# Patient Record
Sex: Female | Born: 1935 | Race: Black or African American | Hispanic: No | Marital: Married | Smoking: Never smoker
Health system: Southern US, Community
[De-identification: ages and names within clinical notes are randomized; demographics above are authoritative.]

## PROBLEM LIST (undated history)

## (undated) DIAGNOSIS — C50919 Malignant neoplasm of unspecified site of unspecified female breast: Secondary | ICD-10-CM

## (undated) DIAGNOSIS — I872 Venous insufficiency (chronic) (peripheral): Secondary | ICD-10-CM

## (undated) DIAGNOSIS — K219 Gastro-esophageal reflux disease without esophagitis: Secondary | ICD-10-CM

## (undated) DIAGNOSIS — Z923 Personal history of irradiation: Secondary | ICD-10-CM

## (undated) DIAGNOSIS — I10 Essential (primary) hypertension: Secondary | ICD-10-CM

## (undated) DIAGNOSIS — M1712 Unilateral primary osteoarthritis, left knee: Secondary | ICD-10-CM

## (undated) DIAGNOSIS — M48061 Spinal stenosis, lumbar region without neurogenic claudication: Secondary | ICD-10-CM

## (undated) DIAGNOSIS — M542 Cervicalgia: Secondary | ICD-10-CM

## (undated) DIAGNOSIS — I83893 Varicose veins of bilateral lower extremities with other complications: Secondary | ICD-10-CM

## (undated) DIAGNOSIS — H409 Unspecified glaucoma: Secondary | ICD-10-CM

## (undated) DIAGNOSIS — M7542 Impingement syndrome of left shoulder: Secondary | ICD-10-CM

## (undated) DIAGNOSIS — E1140 Type 2 diabetes mellitus with diabetic neuropathy, unspecified: Secondary | ICD-10-CM

## (undated) DIAGNOSIS — D696 Thrombocytopenia, unspecified: Secondary | ICD-10-CM

## (undated) DIAGNOSIS — R252 Cramp and spasm: Secondary | ICD-10-CM

## (undated) DIAGNOSIS — D369 Benign neoplasm, unspecified site: Secondary | ICD-10-CM

## (undated) DIAGNOSIS — Z96641 Presence of right artificial hip joint: Secondary | ICD-10-CM

## (undated) DIAGNOSIS — R809 Proteinuria, unspecified: Secondary | ICD-10-CM

## (undated) DIAGNOSIS — D649 Anemia, unspecified: Secondary | ICD-10-CM

## (undated) HISTORY — DX: Cramp and spasm: R25.2

## (undated) HISTORY — DX: Venous insufficiency (chronic) (peripheral): I87.2

## (undated) HISTORY — DX: Varicose veins of bilateral lower extremities with other complications: I83.893

## (undated) HISTORY — DX: Type 2 diabetes mellitus with diabetic neuropathy, unspecified: E11.40

## (undated) HISTORY — DX: Presence of right artificial hip joint: Z96.641

## (undated) HISTORY — PX: JOINT REPLACEMENT: SHX530

## (undated) HISTORY — DX: Anemia, unspecified: D64.9

## (undated) HISTORY — DX: Benign neoplasm, unspecified site: D36.9

## (undated) HISTORY — DX: Cervicalgia: M54.2

## (undated) HISTORY — DX: Proteinuria, unspecified: R80.9

## (undated) HISTORY — PX: REPLACEMENT TOTAL KNEE: SUR1224

## (undated) HISTORY — PX: CHOLECYSTECTOMY: SHX55

## (undated) HISTORY — DX: Essential (primary) hypertension: I10

## (undated) HISTORY — DX: Impingement syndrome of left shoulder: M75.42

## (undated) HISTORY — DX: Unilateral primary osteoarthritis, left knee: M17.12

## (undated) HISTORY — DX: Thrombocytopenia, unspecified: D69.6

## (undated) HISTORY — DX: Spinal stenosis, lumbar region without neurogenic claudication: M48.061

---

## 2004-05-07 ENCOUNTER — Ambulatory Visit: Payer: Self-pay | Admitting: General Surgery

## 2004-07-16 ENCOUNTER — Ambulatory Visit: Payer: Self-pay | Admitting: Oncology

## 2004-09-25 ENCOUNTER — Ambulatory Visit: Payer: Self-pay | Admitting: Gastroenterology

## 2005-01-01 ENCOUNTER — Ambulatory Visit: Payer: Self-pay | Admitting: Oncology

## 2005-01-14 ENCOUNTER — Ambulatory Visit: Payer: Self-pay | Admitting: Oncology

## 2005-05-20 ENCOUNTER — Ambulatory Visit: Payer: Self-pay | Admitting: General Surgery

## 2005-06-24 ENCOUNTER — Emergency Department: Payer: Self-pay | Admitting: Unknown Physician Specialty

## 2005-07-15 ENCOUNTER — Ambulatory Visit: Payer: Self-pay | Admitting: Oncology

## 2005-11-11 ENCOUNTER — Emergency Department: Payer: Self-pay | Admitting: General Practice

## 2005-12-27 ENCOUNTER — Ambulatory Visit: Payer: Self-pay | Admitting: Ophthalmology

## 2006-01-01 ENCOUNTER — Ambulatory Visit: Payer: Self-pay | Admitting: Oncology

## 2006-01-27 ENCOUNTER — Ambulatory Visit: Payer: Self-pay | Admitting: Oncology

## 2006-05-23 ENCOUNTER — Ambulatory Visit: Payer: Self-pay | Admitting: General Surgery

## 2006-07-22 ENCOUNTER — Ambulatory Visit: Payer: Self-pay | Admitting: Oncology

## 2006-08-07 ENCOUNTER — Ambulatory Visit: Payer: Self-pay | Admitting: Oncology

## 2006-10-12 ENCOUNTER — Ambulatory Visit: Payer: Self-pay | Admitting: Emergency Medicine

## 2007-02-07 ENCOUNTER — Ambulatory Visit: Payer: Self-pay | Admitting: Oncology

## 2007-02-10 ENCOUNTER — Ambulatory Visit: Payer: Self-pay | Admitting: Oncology

## 2007-03-09 ENCOUNTER — Ambulatory Visit: Payer: Self-pay | Admitting: Oncology

## 2007-05-05 ENCOUNTER — Ambulatory Visit: Payer: Self-pay | Admitting: Internal Medicine

## 2007-05-25 ENCOUNTER — Ambulatory Visit: Payer: Self-pay | Admitting: Oncology

## 2007-07-08 ENCOUNTER — Ambulatory Visit: Payer: Self-pay | Admitting: Oncology

## 2007-07-18 ENCOUNTER — Other Ambulatory Visit: Payer: Self-pay

## 2007-07-18 ENCOUNTER — Emergency Department: Payer: Self-pay | Admitting: Internal Medicine

## 2007-07-28 ENCOUNTER — Ambulatory Visit: Payer: Self-pay | Admitting: Oncology

## 2007-08-07 ENCOUNTER — Ambulatory Visit: Payer: Self-pay | Admitting: Oncology

## 2007-10-07 ENCOUNTER — Ambulatory Visit: Payer: Self-pay | Admitting: Gastroenterology

## 2008-06-03 ENCOUNTER — Ambulatory Visit: Payer: Self-pay | Admitting: Unknown Physician Specialty

## 2008-06-06 ENCOUNTER — Ambulatory Visit: Payer: Self-pay | Admitting: Internal Medicine

## 2009-01-25 ENCOUNTER — Ambulatory Visit: Payer: Self-pay | Admitting: Anesthesiology

## 2009-02-02 ENCOUNTER — Ambulatory Visit: Payer: Self-pay | Admitting: Anesthesiology

## 2009-02-17 ENCOUNTER — Ambulatory Visit: Payer: Self-pay | Admitting: Anesthesiology

## 2009-03-14 ENCOUNTER — Ambulatory Visit: Payer: Self-pay | Admitting: Anesthesiology

## 2009-04-19 ENCOUNTER — Ambulatory Visit: Payer: Self-pay | Admitting: Anesthesiology

## 2009-05-17 ENCOUNTER — Ambulatory Visit: Payer: Self-pay | Admitting: Anesthesiology

## 2009-06-08 ENCOUNTER — Ambulatory Visit: Payer: Self-pay | Admitting: Internal Medicine

## 2009-07-19 ENCOUNTER — Ambulatory Visit: Payer: Self-pay | Admitting: Anesthesiology

## 2009-09-27 ENCOUNTER — Ambulatory Visit: Payer: Self-pay | Admitting: Anesthesiology

## 2009-10-30 ENCOUNTER — Ambulatory Visit: Payer: Self-pay | Admitting: Unknown Physician Specialty

## 2009-12-17 ENCOUNTER — Emergency Department: Payer: Self-pay | Admitting: Unknown Physician Specialty

## 2009-12-29 ENCOUNTER — Ambulatory Visit: Payer: Self-pay | Admitting: Anesthesiology

## 2010-01-26 ENCOUNTER — Ambulatory Visit: Payer: Self-pay | Admitting: Anesthesiology

## 2010-02-10 ENCOUNTER — Emergency Department: Payer: Self-pay | Admitting: Emergency Medicine

## 2010-02-27 ENCOUNTER — Ambulatory Visit: Payer: Self-pay | Admitting: Anesthesiology

## 2010-05-18 ENCOUNTER — Ambulatory Visit: Payer: Self-pay | Admitting: Anesthesiology

## 2010-06-12 ENCOUNTER — Ambulatory Visit: Payer: Self-pay | Admitting: Internal Medicine

## 2010-06-12 ENCOUNTER — Ambulatory Visit: Payer: Self-pay | Admitting: Anesthesiology

## 2010-07-11 ENCOUNTER — Ambulatory Visit: Payer: Self-pay | Admitting: Anesthesiology

## 2010-07-30 ENCOUNTER — Ambulatory Visit: Payer: Self-pay | Admitting: Anesthesiology

## 2010-08-23 ENCOUNTER — Ambulatory Visit: Payer: Self-pay | Admitting: Unknown Physician Specialty

## 2010-11-30 ENCOUNTER — Ambulatory Visit: Payer: Self-pay | Admitting: Anesthesiology

## 2011-01-13 ENCOUNTER — Emergency Department: Payer: Self-pay | Admitting: *Deleted

## 2011-01-30 ENCOUNTER — Ambulatory Visit: Payer: Self-pay | Admitting: Ophthalmology

## 2011-02-24 ENCOUNTER — Ambulatory Visit: Payer: Self-pay

## 2011-03-27 ENCOUNTER — Ambulatory Visit: Payer: Self-pay | Admitting: Anesthesiology

## 2011-05-08 ENCOUNTER — Emergency Department: Payer: Self-pay | Admitting: Emergency Medicine

## 2011-05-08 LAB — COMPREHENSIVE METABOLIC PANEL
BUN: 16 mg/dL (ref 7–18)
Calcium, Total: 9.6 mg/dL (ref 8.5–10.1)
Chloride: 106 mmol/L (ref 98–107)
Co2: 27 mmol/L (ref 21–32)
EGFR (African American): >60
EGFR (Non-African Amer.): >60
Glucose: 106 mg/dL — ABNORMAL HIGH (ref 65–99)
Osmolality: 290 (ref 275–301)
SGOT(AST): 24 U/L (ref 15–37)
SGPT (ALT): 20 U/L
Sodium: 145 mmol/L (ref 136–145)
Total Protein: 7.2 g/dL (ref 6.4–8.2)

## 2011-05-17 ENCOUNTER — Ambulatory Visit: Payer: Self-pay | Admitting: Anesthesiology

## 2011-06-03 ENCOUNTER — Ambulatory Visit: Payer: Self-pay

## 2011-06-03 ENCOUNTER — Emergency Department: Payer: Self-pay | Admitting: Emergency Medicine

## 2011-06-03 LAB — CBC
HCT: 35.5 % (ref 35.0–47.0)
MCH: 28 pg (ref 26.0–34.0)
MCHC: 33.1 g/dL (ref 32.0–36.0)
MCV: 85 fL (ref 80–100)
Platelet: 136 10*3/uL — ABNORMAL LOW (ref 150–440)
RBC: 4.2 10*6/uL (ref 3.80–5.20)
RDW: 13.7 % (ref 11.5–14.5)

## 2011-06-03 LAB — COMPREHENSIVE METABOLIC PANEL
Alkaline Phosphatase: 70 U/L (ref 50–136)
Anion Gap: 7 (ref 7–16)
BUN: 12 mg/dL (ref 7–18)
Chloride: 106 mmol/L (ref 98–107)
Co2: 30 mmol/L (ref 21–32)
Creatinine: 0.89 mg/dL (ref 0.60–1.30)
EGFR (African American): >60
EGFR (Non-African Amer.): >60
Glucose: 101 mg/dL — ABNORMAL HIGH (ref 65–99)
Osmolality: 285 (ref 275–301)
SGPT (ALT): 19 U/L
Total Protein: 6.8 g/dL (ref 6.4–8.2)

## 2011-06-03 LAB — TROPONIN I: Troponin-I: <0.02 ng/mL

## 2011-06-13 ENCOUNTER — Ambulatory Visit: Payer: Self-pay | Admitting: Internal Medicine

## 2011-08-01 ENCOUNTER — Ambulatory Visit: Payer: Self-pay | Admitting: Anesthesiology

## 2011-09-12 ENCOUNTER — Ambulatory Visit: Payer: Self-pay | Admitting: Unknown Physician Specialty

## 2011-10-31 ENCOUNTER — Ambulatory Visit: Payer: Self-pay | Admitting: Unknown Physician Specialty

## 2011-12-03 ENCOUNTER — Ambulatory Visit: Admit: 2011-12-03 | Disposition: A | Discharge status: Home / Self Care | Payer: Self-pay | Admitting: Anesthesiology

## 2012-02-13 ENCOUNTER — Ambulatory Visit: Payer: Self-pay | Admitting: Anesthesiology

## 2012-03-17 ENCOUNTER — Ambulatory Visit: Payer: Self-pay | Admitting: Anesthesiology

## 2012-05-19 ENCOUNTER — Ambulatory Visit: Payer: Self-pay | Admitting: Anesthesiology

## 2012-06-24 ENCOUNTER — Ambulatory Visit: Payer: Self-pay | Admitting: Internal Medicine

## 2012-07-02 ENCOUNTER — Ambulatory Visit: Payer: Self-pay | Admitting: Internal Medicine

## 2012-07-08 ENCOUNTER — Ambulatory Visit: Payer: Self-pay | Admitting: Internal Medicine

## 2012-07-08 LAB — CREATININE, SERUM
EGFR (African American): >60
EGFR (Non-African Amer.): 54 — ABNORMAL LOW

## 2012-10-28 ENCOUNTER — Ambulatory Visit: Payer: Self-pay | Admitting: Anesthesiology

## 2013-01-11 ENCOUNTER — Ambulatory Visit: Payer: Self-pay | Admitting: Internal Medicine

## 2013-03-08 ENCOUNTER — Ambulatory Visit: Payer: Self-pay | Admitting: Anesthesiology

## 2013-06-25 ENCOUNTER — Ambulatory Visit: Payer: Self-pay | Admitting: Internal Medicine

## 2013-06-30 ENCOUNTER — Ambulatory Visit: Payer: Self-pay | Admitting: Unknown Physician Specialty

## 2013-07-22 ENCOUNTER — Encounter: Payer: Self-pay | Admitting: Internal Medicine

## 2013-07-23 LAB — URINALYSIS, COMPLETE
BILIRUBIN, UR: NEGATIVE
Bacteria: NONE SEEN
Blood: NEGATIVE
GLUCOSE, UR: NEGATIVE mg/dL (ref 0–75)
KETONE: NEGATIVE
Leukocyte Esterase: NEGATIVE
Nitrite: NEGATIVE
PH: 8 (ref 4.5–8.0)
Protein: NEGATIVE
RBC,UR: NONE SEEN /HPF (ref 0–5)
SPECIFIC GRAVITY: 1.009 (ref 1.003–1.030)
Squamous Epithelial: 1

## 2013-07-24 LAB — URINE CULTURE

## 2013-08-06 ENCOUNTER — Encounter: Payer: Self-pay | Admitting: Internal Medicine

## 2013-08-22 ENCOUNTER — Ambulatory Visit: Payer: Self-pay | Admitting: Physician Assistant

## 2013-09-09 DIAGNOSIS — Z96651 Presence of right artificial knee joint: Secondary | ICD-10-CM | POA: Insufficient documentation

## 2013-09-23 DIAGNOSIS — E1140 Type 2 diabetes mellitus with diabetic neuropathy, unspecified: Secondary | ICD-10-CM | POA: Insufficient documentation

## 2013-09-23 DIAGNOSIS — I872 Venous insufficiency (chronic) (peripheral): Secondary | ICD-10-CM

## 2013-09-23 DIAGNOSIS — R809 Proteinuria, unspecified: Secondary | ICD-10-CM

## 2013-09-23 DIAGNOSIS — M48061 Spinal stenosis, lumbar region without neurogenic claudication: Secondary | ICD-10-CM

## 2013-09-23 HISTORY — DX: Venous insufficiency (chronic) (peripheral): I87.2

## 2013-09-23 HISTORY — DX: Proteinuria, unspecified: R80.9

## 2013-09-23 HISTORY — DX: Spinal stenosis, lumbar region without neurogenic claudication: M48.061

## 2013-11-28 DIAGNOSIS — E1129 Type 2 diabetes mellitus with other diabetic kidney complication: Secondary | ICD-10-CM | POA: Insufficient documentation

## 2013-11-28 DIAGNOSIS — K219 Gastro-esophageal reflux disease without esophagitis: Secondary | ICD-10-CM | POA: Insufficient documentation

## 2013-11-28 DIAGNOSIS — D649 Anemia, unspecified: Secondary | ICD-10-CM

## 2013-11-28 DIAGNOSIS — D369 Benign neoplasm, unspecified site: Secondary | ICD-10-CM

## 2013-11-28 DIAGNOSIS — E1140 Type 2 diabetes mellitus with diabetic neuropathy, unspecified: Secondary | ICD-10-CM

## 2013-11-28 DIAGNOSIS — I10 Essential (primary) hypertension: Secondary | ICD-10-CM

## 2013-11-28 HISTORY — DX: Essential (primary) hypertension: I10

## 2013-11-28 HISTORY — DX: Anemia, unspecified: D64.9

## 2013-11-28 HISTORY — DX: Type 2 diabetes mellitus with diabetic neuropathy, unspecified: E11.40

## 2013-11-28 HISTORY — DX: Benign neoplasm, unspecified site: D36.9

## 2013-11-29 DIAGNOSIS — R252 Cramp and spasm: Secondary | ICD-10-CM

## 2013-11-29 HISTORY — DX: Cramp and spasm: R25.2

## 2013-12-14 DIAGNOSIS — Z96641 Presence of right artificial hip joint: Secondary | ICD-10-CM

## 2013-12-14 HISTORY — DX: Presence of right artificial hip joint: Z96.641

## 2014-03-01 DIAGNOSIS — M542 Cervicalgia: Secondary | ICD-10-CM | POA: Insufficient documentation

## 2014-03-01 DIAGNOSIS — M7542 Impingement syndrome of left shoulder: Secondary | ICD-10-CM

## 2014-03-01 HISTORY — DX: Impingement syndrome of left shoulder: M75.42

## 2014-05-18 ENCOUNTER — Emergency Department: Payer: Self-pay | Admitting: Emergency Medicine

## 2014-06-15 DIAGNOSIS — Z96641 Presence of right artificial hip joint: Secondary | ICD-10-CM | POA: Insufficient documentation

## 2014-07-26 ENCOUNTER — Ambulatory Visit
Admit: 2014-07-26 | Disposition: A | Discharge status: Home / Self Care | Payer: Self-pay | Attending: Family Medicine | Admitting: Family Medicine

## 2014-07-27 ENCOUNTER — Ambulatory Visit
Admit: 2014-07-27 | Disposition: A | Discharge status: Home / Self Care | Payer: Self-pay | Attending: Internal Medicine | Admitting: Internal Medicine

## 2014-09-06 DIAGNOSIS — M25561 Pain in right knee: Secondary | ICD-10-CM

## 2014-09-06 DIAGNOSIS — G8929 Other chronic pain: Secondary | ICD-10-CM | POA: Insufficient documentation

## 2014-09-06 DIAGNOSIS — I83893 Varicose veins of bilateral lower extremities with other complications: Secondary | ICD-10-CM

## 2014-09-06 HISTORY — DX: Varicose veins of bilateral lower extremities with other complications: I83.893

## 2015-01-18 DIAGNOSIS — M79604 Pain in right leg: Secondary | ICD-10-CM | POA: Insufficient documentation

## 2015-01-18 DIAGNOSIS — R600 Localized edema: Secondary | ICD-10-CM | POA: Insufficient documentation

## 2015-01-18 DIAGNOSIS — M79605 Pain in left leg: Secondary | ICD-10-CM

## 2015-03-16 ENCOUNTER — Ambulatory Visit
Admission: EM | Admit: 2015-03-16 | Discharge: 2015-03-16 | Disposition: A | Discharge status: Home / Self Care | Payer: Medicare Other | Attending: Family Medicine | Admitting: Family Medicine

## 2015-03-16 DIAGNOSIS — J40 Bronchitis, not specified as acute or chronic: Secondary | ICD-10-CM | POA: Diagnosis not present

## 2015-03-16 DIAGNOSIS — R059 Cough, unspecified: Secondary | ICD-10-CM

## 2015-03-16 DIAGNOSIS — R05 Cough: Secondary | ICD-10-CM

## 2015-03-16 HISTORY — DX: Essential (primary) hypertension: I10

## 2015-03-16 HISTORY — DX: Gastro-esophageal reflux disease without esophagitis: K21.9

## 2015-03-16 MED ORDER — BENZONATATE 200 MG PO CAPS: 200.0000 mg | ORAL_CAPSULE | Freq: Three times a day (TID) | ORAL | Status: DC | PRN

## 2015-03-16 MED ORDER — AZITHROMYCIN 250 MG PO TABS: ORAL_TABLET | ORAL | Status: DC

## 2015-03-16 MED ORDER — ALBUTEROL SULFATE HFA 108 (90 BASE) MCG/ACT IN AERS: 2.0000 | INHALATION_SPRAY | RESPIRATORY_TRACT | Status: AC | PRN

## 2015-03-16 NOTE — ED Provider Notes (Signed)
CSN: [id number]     Arrival date & time 03/16/15  1200 History   First MD Initiated Contact with Patient 03/16/15 1323    Nurses notes were reviewed. Chief Complaint  Patient presents with  . Cough   Patient reports having a cough for 2 weeks. States doesn't wake her up but she does cough all during the day and night when she goes to bed. She does report having some wheezing but no shortness of breath. The cough is not productive when it is his usage is clear. She states doesn't usually get colds or chest congestion often.    (Consider location/radiation/quality/duration/timing/severity/associated sxs/prior Treatment) Patient is a 81 y.o. female presenting with cough. The history is provided by the patient. No language interpreter was used.  Cough Cough characteristics:  Non-productive and productive Sputum characteristics:  Clear Severity:  Moderate Duration:  2 weeks Timing:  Intermittent Progression:  Worsening Chronicity:  New Context: upper respiratory infection   Relieved by:  Nothing Ineffective treatments:  None tried Associated symptoms: wheezing   Associated symptoms: no chest pain, no fever, no shortness of breath and no sinus congestion   Risk factors: recent infection     Past Medical History  Diagnosis Date  . Hypertension   . GERD (gastroesophageal reflux disease)    Past Surgical History  Procedure Laterality Date  . Cholecystectomy    . Joint replacement     Family History  Problem Relation Age of Onset  . Diabetes Mother   . Heart failure Mother   . Heart failure Father    Social History  Substance Use Topics  . Smoking status: Never Smoker   . Smokeless tobacco: None  . Alcohol Use: No   OB History    No data available     Review of Systems  Constitutional: Negative for fever.  Respiratory: Positive for cough and wheezing. Negative for shortness of breath.   Cardiovascular: Negative for chest pain.  All other systems reviewed and are  negative.   Allergies  Trazodone and nefazodone  Home Medications   Prior to Admission medications   Medication Sig Start Date End Date Taking? Authorizing Provider  acetaminophen (TYLENOL) 500 MG tablet Take 500 mg by mouth every 6 (six) hours as needed.   Yes Historical Provider, MD  aspirin 325 MG EC tablet Take 325 mg by mouth daily.   Yes Historical Provider, MD  Fish Oil-Cholecalciferol (FISH OIL + D3 PO) Take by mouth.   Yes Historical Provider, MD  furosemide (LASIX) 20 MG tablet Take 20 mg by mouth.   Yes Historical Provider, MD  latanoprost (XALATAN) 0.005 % ophthalmic solution 1 drop at bedtime.   Yes Historical Provider, MD  losartan (COZAAR) 25 MG tablet Take 25 mg by mouth daily.   Yes Historical Provider, MD  magnesium oxide (MAG-OX) 400 MG tablet Take 400 mg by mouth daily.   Yes Historical Provider, MD  omeprazole (PRILOSEC) 10 MG capsule Take 10 mg by mouth daily.   Yes Historical Provider, MD  vitamin B-12 (CYANOCOBALAMIN) 50 MCG tablet Take 50 mcg by mouth daily.   Yes Historical Provider, MD  Vitamin D, Ergocalciferol, (DRISDOL) 50000 UNITS CAPS capsule Take 50,000 Units by mouth every 7 (seven) days.   Yes Historical Provider, MD  albuterol (PROVENTIL HFA;VENTOLIN HFA) 108 (90 BASE) MCG/ACT inhaler Inhale 2 puffs into the lungs every 4 (four) hours as needed for wheezing or shortness of breath. 03/16/15   Frederich Cha, MD  azithromycin (ZITHROMAX Z-PAK) 250 MG  tablet Take 2 tablets first day and then 1 po a day for 4 days 03/16/15   Frederich Cha, MD  benzonatate (TESSALON) 200 MG capsule Take 1 capsule (200 mg total) by mouth 3 (three) times daily as needed for cough. 03/16/15   Frederich Cha, MD   Meds Ordered and Administered this Visit  Medications - No data to display  BP 195/78 mmHg  Pulse 77  Temp(Src) 96.5 F (35.8 C) (Tympanic)  Resp 20  Ht 5\' 4"  (1.626 m)  Wt 206 lb (93.441 kg)  BMI 35.34 kg/m2  SpO2 100% No data found.   Physical Exam  Constitutional:  She is oriented to person, place, and time. She appears well-developed and well-nourished.  HENT:  Head: Normocephalic and atraumatic.  Eyes: Conjunctivae are normal. Pupils are equal, round, and reactive to light.  Neck: Normal range of motion. Neck supple. No tracheal deviation present.  Cardiovascular: Normal rate, regular rhythm and normal heart sounds.   Pulmonary/Chest: Effort normal and breath sounds normal.  Abdominal: Soft.  Musculoskeletal: Normal range of motion. She exhibits no edema.  Neurological: She is alert and oriented to person, place, and time.  Skin: Skin is warm and dry.  Psychiatric: She has a normal mood and affect. Her behavior is normal.  Vitals reviewed.   ED Course  Procedures (including critical care time)  Labs Review Labs Reviewed - No data to display  Imaging Review No results found.   Visual Acuity Review  Right Eye Distance:   Left Eye Distance:   Bilateral Distance:    Right Eye Near:   Left Eye Near:    Bilateral Near:         MDM   1. Bronchitis   2. Cough    We'll place on Tessalon Perles, Z-Pak and albuterol inhaler but ask her to see her PCP if not better in a week to 2 weeks.    Frederich Cha, MD 03/16/15 1351

## 2015-03-16 NOTE — ED Notes (Signed)
Sates has had a cough x 2 week. Denies SOB. No fever

## 2015-03-16 NOTE — Discharge Instructions (Signed)
How to Use an Inhaler Using your inhaler correctly is very important. Good technique will make sure that the medicine reaches your lungs.  HOW TO USE AN INHALER:  Take the cap off the inhaler.  If this is the first time using your inhaler, you need to prime it. Shake the inhaler for 5 seconds. Release four puffs into the air, away from your face. Ask your doctor for help if you have questions.  Shake the inhaler for 5 seconds.  Turn the inhaler so the bottle is above the mouthpiece.  Put your pointer finger on top of the bottle. Your thumb holds the bottom of the inhaler.  Open your mouth.  Either hold the inhaler away from your mouth (the width of 2 fingers) or place your lips tightly around the mouthpiece. Ask your doctor which way to use your inhaler.  Breathe out as much air as possible.  Breathe in and push down on the bottle 1 time to release the medicine. You will feel the medicine go in your mouth and throat.  Continue to take a deep breath in very slowly. Try to fill your lungs.  After you have breathed in completely, hold your breath for 10 seconds. This will help the medicine to settle in your lungs. If you cannot hold your breath for 10 seconds, hold it for as long as you can before you breathe out.  Breathe out slowly, through pursed lips. Whistling is an example of pursed lips.  If your doctor has told you to take more than 1 puff, wait at least 15-30 seconds between puffs. This will help you get the best results from your medicine. Do not use the inhaler more than your doctor tells you to.  Put the cap back on the inhaler.  Follow the directions from your doctor or from the inhaler package about cleaning the inhaler. If you use more than one inhaler, ask your doctor which inhalers to use and what order to use them in. Ask your doctor to help you figure out when you will need to refill your inhaler.  If you use a steroid inhaler, always rinse your mouth with water  after your last puff, gargle and spit out the water. Do not swallow the water. GET HELP IF:  The inhaler medicine only partially helps to stop wheezing or shortness of breath.  You are having trouble using your inhaler.  You have some increase in thick spit (phlegm). GET HELP RIGHT AWAY IF:  The inhaler medicine does not help your wheezing or shortness of breath or you have tightness in your chest.  You have dizziness, headaches, or fast heart rate.  You have chills, fever, or night sweats.  You have a large increase of thick spit, or your thick spit is bloody. MAKE SURE YOU:   Understand these instructions.  Will watch your condition.  Will get help right away if you are not doing well or get worse.   This information is not intended to replace advice given to you by your health care provider. Make sure you discuss any questions you have with your health care provider.   Document Released: 01/02/2008 Document Revised: 01/13/2013 Document Reviewed: 10/22/2012 Elsevier Interactive Patient Education 2016 [employer]  Upper Respiratory Infection, Adult Most upper respiratory infections (URIs) are caused by a virus. A URI affects the nose, throat, and upper air passages. The most common type of URI is often called "the common cold." HOME CARE   Take medicines only as  told by your doctor.  Gargle warm saltwater or take cough drops to comfort your throat as told by your doctor.  Use a warm mist humidifier or inhale steam from a shower to increase air moisture. This may make it easier to breathe.  Drink enough fluid to keep your pee (urine) clear or pale yellow.  Eat soups and other clear broths.  Have a healthy diet.  Rest as needed.  Go back to work when your fever is gone or your doctor says it is okay.  You may need to stay home longer to avoid giving your URI to others.  You can also wear a face mask and wash your hands often to prevent spread of the virus.  Use  your inhaler more if you have asthma.  Do not use any tobacco products, including cigarettes, chewing tobacco, or electronic cigarettes. If you need help quitting, ask your doctor. GET HELP IF:  You are getting worse, not better.  Your symptoms are not helped by medicine.  You have chills.  You are getting more short of breath.  You have brown or red mucus.  You have yellow or brown discharge from your nose.  You have pain in your face, especially when you bend forward.  You have a fever.  You have puffy (swollen) neck glands.  You have pain while swallowing.  You have white areas in the back of your throat. GET HELP RIGHT AWAY IF:   You have very bad or constant:  Headache.  Ear pain.  Pain in your forehead, behind your eyes, and over your cheekbones (sinus pain).  Chest pain.  You have long-lasting (chronic) lung disease and any of the following:  Wheezing.  Long-lasting cough.  Coughing up blood.  A change in your usual mucus.  You have a stiff neck.  You have changes in your:  Vision.  Hearing.  Thinking.  Mood. MAKE SURE YOU:   Understand these instructions.  Will watch your condition.  Will get help right away if you are not doing well or get worse.   This information is not intended to replace advice given to you by your health care provider. Make sure you discuss any questions you have with your health care provider.   Document Released: 09/11/2007 Document Revised: 08/09/2014 Document Reviewed: 06/30/2013 Elsevier Interactive Patient Education 2016 Elsevier Inc. Acute Bronchitis Bronchitis is when the airways that extend from the windpipe into the lungs get red, puffy, and painful (inflamed). Bronchitis often causes thick spit (mucus) to develop. This leads to a cough. A cough is the most common symptom of bronchitis. In acute bronchitis, the condition usually begins suddenly and goes away over time (usually in 2 weeks). Smoking,  allergies, and asthma can make bronchitis worse. Repeated episodes of bronchitis may cause more lung problems. HOME CARE  Rest.  Drink enough fluids to keep your pee (urine) clear or pale yellow (unless you need to limit fluids as told by your doctor).  Only take over-the-counter or prescription medicines as told by your doctor.  Avoid smoking and secondhand smoke. These can make bronchitis worse. If you are a smoker, think about using nicotine gum or skin patches. Quitting smoking will help your lungs heal faster.  Reduce the chance of getting bronchitis again by:  Washing your hands often.  Avoiding people with cold symptoms.  Trying not to touch your hands to your mouth, nose, or eyes.  Follow up with your doctor as told. GET HELP IF: Your symptoms  do not improve after 1 week of treatment. Symptoms include:  Cough.  Fever.  Coughing up thick spit.  Body aches.  Chest congestion.  Chills.  Shortness of breath.  Sore throat. GET HELP RIGHT AWAY IF:   You have an increased fever.  You have chills.  You have severe shortness of breath.  You have bloody thick spit (sputum).  You throw up (vomit) often.  You lose too much body fluid (dehydration).  You have a severe headache.  You faint. MAKE SURE YOU:   Understand these instructions.  Will watch your condition.  Will get help right away if you are not doing well or get worse.   This information is not intended to replace advice given to you by your health care provider. Make sure you discuss any questions you have with your health care provider.   Document Released: 09/11/2007 Document Revised: 11/25/2012 Document Reviewed: 09/15/2012 Elsevier Interactive Patient Education [employer].

## 2015-04-06 DIAGNOSIS — M1712 Unilateral primary osteoarthritis, left knee: Secondary | ICD-10-CM

## 2015-04-06 HISTORY — DX: Unilateral primary osteoarthritis, left knee: M17.12

## 2015-05-16 DIAGNOSIS — M542 Cervicalgia: Secondary | ICD-10-CM | POA: Insufficient documentation

## 2015-05-16 HISTORY — DX: Cervicalgia: M54.2

## 2015-05-18 ENCOUNTER — Other Ambulatory Visit: Payer: Self-pay | Admitting: Unknown Physician Specialty

## 2015-05-18 DIAGNOSIS — M542 Cervicalgia: Secondary | ICD-10-CM

## 2015-06-05 ENCOUNTER — Other Ambulatory Visit: Payer: Self-pay | Admitting: Internal Medicine

## 2015-06-05 DIAGNOSIS — Z1231 Encounter for screening mammogram for malignant neoplasm of breast: Secondary | ICD-10-CM

## 2015-06-05 DIAGNOSIS — D696 Thrombocytopenia, unspecified: Secondary | ICD-10-CM | POA: Insufficient documentation

## 2015-06-05 HISTORY — DX: Thrombocytopenia, unspecified: D69.6

## 2015-06-13 ENCOUNTER — Ambulatory Visit
Admission: RE | Admit: 2015-06-13 | Discharge: 2015-06-13 | Disposition: A | Discharge status: Home / Self Care | Payer: Medicare HMO | Source: Ambulatory Visit | Attending: Unknown Physician Specialty | Admitting: Unknown Physician Specialty

## 2015-06-13 DIAGNOSIS — M542 Cervicalgia: Secondary | ICD-10-CM | POA: Insufficient documentation

## 2015-06-13 DIAGNOSIS — M47812 Spondylosis without myelopathy or radiculopathy, cervical region: Secondary | ICD-10-CM | POA: Diagnosis not present

## 2015-06-13 DIAGNOSIS — G959 Disease of spinal cord, unspecified: Secondary | ICD-10-CM | POA: Insufficient documentation

## 2015-07-31 ENCOUNTER — Ambulatory Visit: Payer: Medicare Other | Attending: Internal Medicine

## 2015-08-02 ENCOUNTER — Ambulatory Visit
Admission: EM | Admit: 2015-08-02 | Discharge: 2015-08-02 | Disposition: A | Discharge status: Home / Self Care | Payer: Medicare HMO | Attending: Family Medicine | Admitting: Family Medicine

## 2015-08-02 DIAGNOSIS — M545 Low back pain, unspecified: Secondary | ICD-10-CM

## 2015-08-02 DIAGNOSIS — N390 Urinary tract infection, site not specified: Secondary | ICD-10-CM

## 2015-08-02 HISTORY — DX: Unspecified glaucoma: H40.9

## 2015-08-02 LAB — URINALYSIS COMPLETE WITH MICROSCOPIC (ARMC ONLY)
BILIRUBIN URINE: NEGATIVE
Glucose, UA: NEGATIVE mg/dL
KETONES UR: NEGATIVE mg/dL
Nitrite: NEGATIVE
PH: 6 (ref 5.0–8.0)
PROTEIN: NEGATIVE mg/dL
SPECIFIC GRAVITY, URINE: 1.01 (ref 1.005–1.030)

## 2015-08-02 MED ORDER — NITROFURANTOIN MONOHYD MACRO 100 MG PO CAPS: 100.0000 mg | ORAL_CAPSULE | Freq: Two times a day (BID) | ORAL | Status: DC

## 2015-08-02 NOTE — ED Provider Notes (Signed)
CSN: [id number]     Arrival date & time 08/02/15  1303 History   First MD Initiated Contact with Patient 08/02/15 1407     Chief Complaint  Patient presents with  . Back Pain    2 weeks of right low back pain in absence of injury. "I'm peeing a lot" but denies pain with urination. Pain is worse with walking   (Consider location/radiation/quality/duration/timing/severity/associated sxs/prior Treatment) HPI: Patient presents today with lower back pain. Patient denies any trauma or fall. She has been using Tylenol for her generalized pain. She also does admit to some urinary frequency. She does admit to getting UTIs. She denies any fever, chills, confusion, headache, abdominal pain, nausea, vomiting. She denies any known history of any back problems. She denies incontinence or foot drop or weakness of lower extremities. She does use a cane to get around normally.  Past Medical History  Diagnosis Date  . Hypertension   . GERD (gastroesophageal reflux disease)   . Glaucoma    Past Surgical History  Procedure Laterality Date  . Cholecystectomy    . Joint replacement     Family History  Problem Relation Age of Onset  . Diabetes Mother   . Heart failure Mother   . Heart failure Father    Social History  Substance Use Topics  . Smoking status: Never Smoker   . Smokeless tobacco: None  . Alcohol Use: No   OB History    No data available     Review of Systems: Negative except mentioned above.   Allergies  Trazodone and nefazodone  Home Medications   Prior to Admission medications   Medication Sig Start Date End Date Taking? Authorizing Provider  acetaminophen (TYLENOL) 500 MG tablet Take 500 mg by mouth every 6 (six) hours as needed.   Yes Historical Provider, MD  aspirin 325 MG EC tablet Take 325 mg by mouth daily.   Yes Historical Provider, MD  Fish Oil-Cholecalciferol (FISH OIL + D3 PO) Take by mouth.   Yes Historical Provider, MD  furosemide (LASIX) 20 MG tablet Take 20 mg  by mouth.   Yes Historical Provider, MD  latanoprost (XALATAN) 0.005 % ophthalmic solution 1 drop at bedtime.   Yes Historical Provider, MD  losartan (COZAAR) 25 MG tablet Take 25 mg by mouth daily.   Yes Historical Provider, MD  magnesium oxide (MAG-OX) 400 MG tablet Take 400 mg by mouth daily.   Yes Historical Provider, MD  omeprazole (PRILOSEC) 10 MG capsule Take 10 mg by mouth daily.   Yes Historical Provider, MD  Vitamin D, Ergocalciferol, (DRISDOL) 50000 UNITS CAPS capsule Take 50,000 Units by mouth every 7 (seven) days.   Yes Historical Provider, MD  albuterol (PROVENTIL HFA;VENTOLIN HFA) 108 (90 BASE) MCG/ACT inhaler Inhale 2 puffs into the lungs every 4 (four) hours as needed for wheezing or shortness of breath. 03/16/15   Frederich Cha, MD  azithromycin (ZITHROMAX Z-PAK) 250 MG tablet Take 2 tablets first day and then 1 po a day for 4 days 03/16/15   Frederich Cha, MD  benzonatate (TESSALON) 200 MG capsule Take 1 capsule (200 mg total) by mouth 3 (three) times daily as needed for cough. 03/16/15   Frederich Cha, MD  nitrofurantoin, macrocrystal-monohydrate, (MACROBID) 100 MG capsule Take 1 capsule (100 mg total) by mouth 2 (two) times daily. 08/02/15   Paulina Fusi, MD  vitamin B-12 (CYANOCOBALAMIN) 50 MCG tablet Take 50 mcg by mouth daily.    Historical Provider, MD   Meds Ordered  and Administered this Visit  Medications - No data to display  BP 131/64 mmHg  Pulse 76  Temp(Src) 98 F (36.7 C) (Oral)  Resp 22  Ht 5\' 4"  (1.626 m)  Wt 208 lb (94.348 kg)  BMI 35.69 kg/m2  SpO2 100% No data found.   Physical Exam  GENERAL: NAD HEENT: no pharyngeal erythema, no exudate RESP: CTA B CARD: RRR ABD: +BS, NT/ND, no rebound or guarding, no flank tenderness MSK: generalized lower back tenderness, FROM, negative SLR, no foot drop appreciated  NEURO: AAO ED Course  Procedures (including critical care time)  Labs Review Labs Reviewed  URINALYSIS COMPLETEWITH MICROSCOPIC ([facility] ONLY) -  Abnormal; Notable for the following:    APPearance CLOUDY (*)    Hgb urine dipstick TRACE (*)    Leukocytes, UA 3+ (*)    Bacteria, UA MANY (*)    Squamous Epithelial / LPF 0-5 (*)    All other components within normal limits  URINE CULTURE    Imaging Review No results found.    MDM    1. Urinary tract infection without hematuria, site unspecified   2. Midline low back pain without sciatica    Will treat patient for UTI to see if lower back pain resolves. I did discuss with patient that she likely does have degenerative changes of her lower back given her age. I do recommend that she follow-up with her primary care physician if symptoms do persist or worsen as discussed. If she has any problems with incontinence or foot drop she should be seen immediately. Patient addresses understanding. She will continue taking her Tylenol and low dose Ibuprofen when necessary for pain.     Paulina Fusi, MD 08/02/15 [phone]

## 2015-08-04 LAB — URINE CULTURE

## 2015-08-08 ENCOUNTER — Ambulatory Visit
Admission: RE | Admit: 2015-08-08 | Discharge: 2015-08-08 | Disposition: A | Discharge status: Home / Self Care | Payer: Medicare HMO | Source: Ambulatory Visit | Attending: Internal Medicine | Admitting: Internal Medicine

## 2015-08-08 DIAGNOSIS — Z1231 Encounter for screening mammogram for malignant neoplasm of breast: Secondary | ICD-10-CM | POA: Insufficient documentation

## 2015-08-08 HISTORY — DX: Malignant neoplasm of unspecified site of unspecified female breast: C50.919

## 2015-08-17 DIAGNOSIS — G8929 Other chronic pain: Secondary | ICD-10-CM | POA: Insufficient documentation

## 2015-08-17 DIAGNOSIS — M545 Low back pain: Secondary | ICD-10-CM

## 2015-12-28 ENCOUNTER — Other Ambulatory Visit: Payer: Self-pay | Admitting: Unknown Physician Specialty

## 2015-12-28 DIAGNOSIS — M545 Low back pain: Principal | ICD-10-CM

## 2015-12-28 DIAGNOSIS — G8929 Other chronic pain: Secondary | ICD-10-CM

## 2016-01-11 ENCOUNTER — Ambulatory Visit
Admission: RE | Admit: 2016-01-11 | Discharge: 2016-01-11 | Disposition: A | Discharge status: Home / Self Care | Payer: Medicare HMO | Source: Ambulatory Visit | Attending: Unknown Physician Specialty | Admitting: Unknown Physician Specialty

## 2016-01-11 DIAGNOSIS — M48061 Spinal stenosis, lumbar region without neurogenic claudication: Secondary | ICD-10-CM | POA: Insufficient documentation

## 2016-01-11 DIAGNOSIS — M8938 Hypertrophy of bone, other site: Secondary | ICD-10-CM | POA: Insufficient documentation

## 2016-01-11 DIAGNOSIS — M545 Low back pain: Secondary | ICD-10-CM | POA: Diagnosis present

## 2016-01-11 DIAGNOSIS — M5126 Other intervertebral disc displacement, lumbar region: Secondary | ICD-10-CM | POA: Diagnosis not present

## 2016-01-11 DIAGNOSIS — G8929 Other chronic pain: Secondary | ICD-10-CM | POA: Diagnosis not present

## 2016-01-11 DIAGNOSIS — M4807 Spinal stenosis, lumbosacral region: Secondary | ICD-10-CM | POA: Insufficient documentation

## 2016-01-11 DIAGNOSIS — M4317 Spondylolisthesis, lumbosacral region: Secondary | ICD-10-CM | POA: Insufficient documentation

## 2016-01-11 DIAGNOSIS — M1288 Other specific arthropathies, not elsewhere classified, other specified site: Secondary | ICD-10-CM | POA: Diagnosis not present

## 2016-05-16 ENCOUNTER — Other Ambulatory Visit: Payer: Self-pay

## 2016-05-16 DIAGNOSIS — R350 Frequency of micturition: Secondary | ICD-10-CM

## 2016-05-17 ENCOUNTER — Other Ambulatory Visit
Admission: RE | Admit: 2016-05-17 | Discharge: 2016-05-17 | Disposition: A | Discharge status: Home / Self Care | Payer: Medicare HMO | Source: Ambulatory Visit | Attending: Urology | Admitting: Urology

## 2016-05-17 ENCOUNTER — Ambulatory Visit (INDEPENDENT_AMBULATORY_CARE_PROVIDER_SITE_OTHER): Payer: Medicare HMO | Admitting: Urology

## 2016-05-17 ENCOUNTER — Encounter: Payer: Self-pay | Admitting: Urology

## 2016-05-17 VITALS — BP 156/77 | HR 76 | Ht 64.0 in | Wt 208.0 lb

## 2016-05-17 DIAGNOSIS — R350 Frequency of micturition: Secondary | ICD-10-CM

## 2016-05-17 DIAGNOSIS — R32 Unspecified urinary incontinence: Secondary | ICD-10-CM | POA: Diagnosis not present

## 2016-05-17 DIAGNOSIS — N3941 Urge incontinence: Secondary | ICD-10-CM

## 2016-05-17 DIAGNOSIS — N3000 Acute cystitis without hematuria: Secondary | ICD-10-CM

## 2016-05-17 LAB — URINALYSIS, COMPLETE (UACMP) WITH MICROSCOPIC
BILIRUBIN URINE: NEGATIVE
Glucose, UA: NEGATIVE mg/dL
HGB URINE DIPSTICK: NEGATIVE
KETONES UR: NEGATIVE mg/dL
NITRITE: POSITIVE — AB
PH: 5.5 (ref 5.0–8.0)
Protein, ur: NEGATIVE mg/dL
RBC / HPF: NONE SEEN RBC/hpf (ref 0–5)
Specific Gravity, Urine: 1.01 (ref 1.005–1.030)

## 2016-05-17 LAB — BLADDER SCAN AMB NON-IMAGING

## 2016-05-17 NOTE — Progress Notes (Signed)
05/17/2016 3:26 PM   Shannon [name] [birth year] [id number]  Referring provider: Ezequiel Kayser, MD [place] [facility] [place], [place] [address]  Chief Complaint  Patient presents with  . Urinary Frequency    New patient    HPI: Pleasant 83 year old female who presents today accompanied by her daughter for further evaluation of urinary frequency, nocturia, and enuresis.  She reports that over the past year, she's had significantly worsening of her urinary symptoms including daytime urgency, frequency along with significant nocturia and episodes of enuresis. During the day, she occasionally will be able to make it to the bathroom in time and wet herself. At nighttime, she lays a towel on her bed is often she can't get up and make it to the restroom on time or will just wake up wet.  She denies any gross hematuria, dysuria, or history of recent or recurrent urinary tract infections.  It does appear that she was treated in 07/2015 with Macrobid for an Escherichia coli urinary tract infection.  This was pansensitive.  She does ambulate with a walker and is somewhat unsteady on her feet.  Although she denies a history of diabetes, it is mentioned in her chart. Her most recent hemoglobin A1c was 5.6 in 05/2015.  She does have a personal history of breast cancer.  She drinks mostly water throughout the day into the late evening.  She denies ever being previously evaluated by urologist. She currently takes no medications for OAB.   PMH: Past Medical History:  Diagnosis Date  . Adenomatous polyps 11/28/2013   Overview:  Colonoscopy 03/04/2000  . Anemia 11/28/2013  . Benign essential hypertension 11/28/2013  . Breast cancer ([place])   . Cervical pain 05/16/2015  . Diabetic neuropathy ([place]) 11/28/2013  . GERD (gastroesophageal reflux disease)   . Glaucoma   . Hypertension   . Impingement syndrome of left shoulder 03/01/2014  . Lumbar spinal stenosis 09/23/2013  . Muscle  cramps 11/29/2013  . Primary osteoarthritis of left knee 04/06/2015  . Proteinuria 09/23/2013  . Status post total hip replacement, right 12/14/2013  . Thrombocytopenia ([place]) 06/05/2015  . Varicose veins of leg with swelling, bilateral 09/06/2014  . Venous insufficiency of both lower extremities 09/23/2013   Overview:  H/O ulceration    Surgical History: Past Surgical History:  Procedure Laterality Date  . CHOLECYSTECTOMY    . JOINT REPLACEMENT      Home Medications:  Allergies as of 05/17/2016      Reactions   Trazodone And Nefazodone Other (See Comments)   Hallucinate      Medication List       Accurate as of 05/17/16  3:26 PM. Always use your most recent med list.          acetaminophen 500 MG tablet Commonly known as:  TYLENOL Take 500 mg by mouth every 6 (six) hours as needed.   albuterol 108 (90 Base) MCG/ACT inhaler Commonly known as:  PROVENTIL HFA;VENTOLIN HFA Inhale 2 puffs into the lungs every 4 (four) hours as needed for wheezing or shortness of breath.   aspirin EC 81 MG tablet Take 81 mg by mouth daily.   FISH OIL + D3 PO Take by mouth.   furosemide 20 MG tablet Commonly known as:  LASIX Take 20 mg by mouth.   latanoprost 0.005 % ophthalmic solution Commonly known as:  XALATAN 1 drop at bedtime.   losartan 25 MG tablet Commonly known as:  COZAAR Take 25 mg by mouth daily.  magnesium oxide 400 MG tablet Commonly known as:  MAG-OX Take 400 mg by mouth daily.   omeprazole 10 MG capsule Commonly known as:  PRILOSEC Take 10 mg by mouth daily.   vitamin B-12 50 MCG tablet Commonly known as:  CYANOCOBALAMIN Take 50 mcg by mouth daily.   Vitamin D (Ergocalciferol) 50000 units Caps capsule Commonly known as:  DRISDOL Take 50,000 Units by mouth every 7 (seven) days.       Allergies:  Allergies  Allergen Reactions  . Trazodone And Nefazodone Other (See Comments)    Hallucinate    Family History: Family History  Problem Relation Age of  Onset  . Diabetes Mother   . Heart failure Mother   . Heart failure Father   . Breast cancer Sister     Social History:  reports that she has never smoked. She has never used smokeless tobacco. She reports that she does not drink alcohol. Her drug history is not on file.  ROS: UROLOGY Frequent Urination?: Yes Hard to postpone urination?: No Burning/pain with urination?: No Get up at night to urinate?: Yes Leakage of urine?: No Urine stream starts and stops?: No Trouble starting stream?: No Do you have to strain to urinate?: No Blood in urine?: No Urinary tract infection?: No Sexually transmitted disease?: No Injury to kidneys or bladder?: No Painful intercourse?: No Weak stream?: No Currently pregnant?: No Vaginal bleeding?: No Last menstrual period?: n  Gastrointestinal Nausea?: No Vomiting?: No Indigestion/heartburn?: No Diarrhea?: No Constipation?: No  Constitutional Fever: No Night sweats?: No Weight loss?: No Fatigue?: No  Skin Skin rash/lesions?: No Itching?: No  Eyes Blurred vision?: Yes Double vision?: No  Ears/Nose/Throat Sore throat?: No Sinus problems?: No  Hematologic/Lymphatic Swollen glands?: No Easy bruising?: No  Cardiovascular Leg swelling?: No Chest pain?: No  Respiratory Cough?: No Shortness of breath?: No  Endocrine Excessive thirst?: No  Musculoskeletal Back pain?: Yes Joint pain?: Yes  Neurological Headaches?: No Dizziness?: No  Psychologic Depression?: No Anxiety?: No  Physical Exam: BP (!) 156/77   Pulse 76   Ht 5\' 4"  (1.626 m)   Wt 208 lb (94.3 kg)   BMI 35.70 kg/m   Constitutional:  Alert and oriented, No acute distress.  Extremely pleasant. Copy by daughter today. HEENT: [place] AT, moist mucus membranes.  Trachea midline, no masses. Cardiovascular: No clubbing, cyanosis, or edema. Respiratory: Normal respiratory effort, no increased work of breathing. GI: Abdomen is soft, nontender, nondistended, no  abdominal masses GU: No CVA tenderness. Skin: No rashes, bruises or suspicious lesions. Neurologic: Grossly intact, no focal deficits, moving all 4 extremities.  Ambulating deliberately, slow with cane. Psychiatric: Normal mood and affect.  Laboratory Data: Lab Results  Component Value Date   WBC 4.9 06/03/2011   HGB 11.8 (L) 06/03/2011   HCT 35.5 06/03/2011   MCV 85 06/03/2011   PLT 136 (L) 06/03/2011    Lab Results  Component Value Date   CREATININE 0.96 07/08/2012    Urinalysis Component     Latest Ref Rng & Units 05/17/2016  Color, Urine     YELLOW YELLOW  Appearance     CLEAR CLEAR  Glucose     NEGATIVE mg/dL NEGATIVE  Bilirubin Urine     NEGATIVE NEGATIVE  Ketones, ur     NEGATIVE mg/dL NEGATIVE  Specific Gravity, Urine     1.005 - 1.030 1.010  Hgb urine dipstick     NEGATIVE NEGATIVE  pH     5.0 - 8.0 5.5  Protein  NEGATIVE mg/dL NEGATIVE  Nitrite     NEGATIVE POSITIVE (A)  Leukocytes, UA     NEGATIVE SMALL (A)  RBC / HPF     0 - 5 RBC/hpf NONE SEEN  WBC, UA     0 - 5 WBC/hpf 6-30  Bacteria, UA     NONE SEEN MANY (A)  Squamous Epithelial / LPF     NONE SEEN 0-5 (A)    Pertinent Imaging: Results for orders placed or performed in visit on 05/17/16  BLADDER SCAN AMB NON-IMAGING  Result Value Ref Range   Scan Result 33ml     Assessment & Plan:   Pleasant 83 year old female with worsening urinary urgency, frequency, incontinence over the past year. UA today is frankly positive in highly suspicious for infection which was certainly could be the cause or at least exacerbating her OAB symptoms.  We will plan to treat this infection and then reassess her voiding symptoms once the inflammatationlrelated to cystitis has resolved.     1. Acute cystitis without hematuria Will treat based on culture and sensitivity data once resulted Advised to call for treatment sooner if any worsening of symptoms including fevers, chills, n/v, AMS  2. Urinary  frequency - BLADDER SCAN AMB NON-IMAGING - Urine culture; Future  3. Urge incontinence As above  4. Enuresis As above   Return in about 2 months (around 07/15/2016) for reassess voiding symptoms, UA.  Hollice Espy, MD  [facility] [address] [place], [place] [address] [phone]

## 2016-05-20 ENCOUNTER — Telehealth: Payer: Self-pay

## 2016-05-20 DIAGNOSIS — N390 Urinary tract infection, site not specified: Secondary | ICD-10-CM

## 2016-05-20 LAB — URINE CULTURE: Culture: >=100000 — AB

## 2016-05-20 MED ORDER — SULFAMETHOXAZOLE-TRIMETHOPRIM 800-160 MG PO TABS: 1.0000 | ORAL_TABLET | Freq: Two times a day (BID) | ORAL | 0 refills | Status: AC

## 2016-05-20 NOTE — Telephone Encounter (Signed)
Spoke with pt in reference +ucx. Made aware abx were sent to pharmacy. Pt voiced understanding.

## 2016-05-20 NOTE — Telephone Encounter (Signed)
-----   Message from Hollice Espy, MD sent at 05/20/2016 12:06 PM EST ----- Please treat with Bactrim DS bid x 7 days.   Hollice Espy, MD

## 2016-07-18 ENCOUNTER — Other Ambulatory Visit: Payer: Self-pay

## 2016-07-18 DIAGNOSIS — R350 Frequency of micturition: Secondary | ICD-10-CM

## 2016-07-19 ENCOUNTER — Other Ambulatory Visit
Admission: RE | Admit: 2016-07-19 | Discharge: 2016-07-19 | Disposition: A | Discharge status: Home / Self Care | Payer: Medicare HMO | Source: Ambulatory Visit | Attending: Urology | Admitting: Urology

## 2016-07-19 ENCOUNTER — Ambulatory Visit (INDEPENDENT_AMBULATORY_CARE_PROVIDER_SITE_OTHER): Payer: Medicare HMO | Admitting: Urology

## 2016-07-19 ENCOUNTER — Encounter: Payer: Self-pay | Admitting: Urology

## 2016-07-19 VITALS — BP 153/74 | HR 67 | Ht 64.0 in | Wt 207.0 lb

## 2016-07-19 DIAGNOSIS — N3941 Urge incontinence: Secondary | ICD-10-CM | POA: Diagnosis not present

## 2016-07-19 DIAGNOSIS — R32 Unspecified urinary incontinence: Secondary | ICD-10-CM

## 2016-07-19 DIAGNOSIS — R350 Frequency of micturition: Secondary | ICD-10-CM

## 2016-07-19 LAB — URINALYSIS, COMPLETE (UACMP) WITH MICROSCOPIC
BILIRUBIN URINE: NEGATIVE
GLUCOSE, UA: NEGATIVE mg/dL
Hgb urine dipstick: NEGATIVE
KETONES UR: NEGATIVE mg/dL
Nitrite: NEGATIVE
PH: 5.5 (ref 5.0–8.0)
PROTEIN: NEGATIVE mg/dL
RBC / HPF: NONE SEEN RBC/hpf (ref 0–5)
Specific Gravity, Urine: 1.015 (ref 1.005–1.030)

## 2016-08-06 NOTE — Progress Notes (Signed)
07/19/2016 5:59 PM   Shannon [name] 1924/07/10 [id number]  Referring provider: Ezequiel Kayser, MD [facility] [place], [place] [address]  Chief Complaint  Patient presents with  . Cystitis    2 month follow up  . Urinary Frequency    HPI: Pleasant 83 year old female who presents today who returns Today for follow-up.   At last visit, she was found to have occult UTI which was thought to perhaps be contributory to her urinary symptoms. She was treated with a week long course of Bactrim for this and her urine appears clear today. Unfortunately, her symptoms did not improve or change with treatment.  She reports that over the past year, she's had significantly worsening of her urinary symptoms including daytime urgency, frequency along with significant nocturia and episodes of enuresis. During the day, she occasionally will be able to make it to the bathroom in time and wet herself. At nighttime, she lays a towel on her bed is often she can't get up and make it to the restroom on time or will just wake up wet.  PVR at last visit minimal (34 cc)   PMH: Past Medical History:  Diagnosis Date  . Adenomatous polyps 11/28/2013   Overview:  Colonoscopy 03/04/2000  . Anemia 11/28/2013  . Benign essential hypertension 11/28/2013  . Breast cancer ([place])   . Cervical pain 05/16/2015  . Diabetic neuropathy ([place]) 11/28/2013  . GERD (gastroesophageal reflux disease)   . Glaucoma   . Hypertension   . Impingement syndrome of left shoulder 03/01/2014  . Lumbar spinal stenosis 09/23/2013  . Muscle cramps 11/29/2013  . Primary osteoarthritis of left knee 04/06/2015  . Proteinuria 09/23/2013  . Status post total hip replacement, right 12/14/2013  . Thrombocytopenia ([place]) 06/05/2015  . Varicose veins of leg with swelling, bilateral 09/06/2014  . Venous insufficiency of both lower extremities 09/23/2013   Overview:  H/O ulceration    Surgical History: Past Surgical History:    Procedure Laterality Date  . CHOLECYSTECTOMY    . JOINT REPLACEMENT    . REPLACEMENT TOTAL KNEE Right     Home Medications:  Allergies as of 07/19/2016      Reactions   Trazodone And Nefazodone Other (See Comments)   Hallucinate      Medication List       Accurate as of 07/19/16 11:59 PM. Always use your most recent med list.          acetaminophen 500 MG tablet Commonly known as:  TYLENOL Take 500 mg by mouth every 6 (six) hours as needed.   albuterol 108 (90 Base) MCG/ACT inhaler Commonly known as:  PROVENTIL HFA;VENTOLIN HFA Inhale 2 puffs into the lungs every 4 (four) hours as needed for wheezing or shortness of breath.   amLODipine 2.5 MG tablet Commonly known as:  NORVASC Take by mouth.   aspirin EC 81 MG tablet Take 81 mg by mouth daily.   CALCIUM ASCORBATE PO Take by mouth.   calcium carbonate 1250 (500 Ca) MG chewable tablet Commonly known as:  OS-CAL Chew by mouth.   FISH OIL + D3 PO Take by mouth.   FISH OIL PO Take by mouth.   furosemide 20 MG tablet Commonly known as:  LASIX Take 20 mg by mouth.   latanoprost 0.005 % ophthalmic solution Commonly known as:  XALATAN 1 drop at bedtime.   losartan 25 MG tablet Commonly known as:  COZAAR Take 25 mg by mouth daily.   magnesium  oxide 400 MG tablet Commonly known as:  MAG-OX Take 400 mg by mouth daily.   MULTI-VITAMINS Tabs Take by mouth.   omeprazole 10 MG capsule Commonly known as:  PRILOSEC Take 10 mg by mouth daily.   vitamin B-12 50 MCG tablet Commonly known as:  CYANOCOBALAMIN Take 50 mcg by mouth daily.   Vitamin D (Ergocalciferol) 50000 units Caps capsule Commonly known as:  DRISDOL Take 50,000 Units by mouth every 7 (seven) days.       Allergies:  Allergies  Allergen Reactions  . Trazodone And Nefazodone Other (See Comments)    Hallucinate    Family History: Family History  Problem Relation Age of Onset  . Diabetes Mother   . Heart failure Mother   . Heart  failure Father   . Breast cancer Sister   . Prostate cancer Brother   . Kidney cancer Neg Hx   . Bladder Cancer Neg Hx     Social History:  reports that she has never smoked. She has never used smokeless tobacco. She reports that she does not drink alcohol or use drugs.  ROS: UROLOGY Frequent Urination?: Yes Hard to postpone urination?: No Burning/pain with urination?: No Get up at night to urinate?: No Leakage of urine?: No Urine stream starts and stops?: No Trouble starting stream?: No Do you have to strain to urinate?: No Blood in urine?: No Urinary tract infection?: No Sexually transmitted disease?: No Injury to kidneys or bladder?: No Painful intercourse?: No Weak stream?: No Currently pregnant?: No Vaginal bleeding?: No Last menstrual period?: n  Gastrointestinal Nausea?: No Vomiting?: No Indigestion/heartburn?: No Diarrhea?: No Constipation?: No  Constitutional Fever: No Night sweats?: No Weight loss?: No Fatigue?: No  Skin Skin rash/lesions?: No Itching?: No  Eyes Blurred vision?: No Double vision?: No  Ears/Nose/Throat Sore throat?: No Sinus problems?: No  Hematologic/Lymphatic Swollen glands?: No Easy bruising?: No  Cardiovascular Leg swelling?: No Chest pain?: No  Respiratory Cough?: No Shortness of breath?: No  Endocrine Excessive thirst?: No  Musculoskeletal Back pain?: Yes Joint pain?: No  Neurological Headaches?: No Dizziness?: No  Psychologic Depression?: No Anxiety?: No  Physical Exam: BP (!) 153/74   Pulse 67   Ht 5\' 4"  (1.626 m)   Wt 207 lb (93.9 kg)   BMI 35.53 kg/m   Constitutional:  Alert and oriented, No acute distress.  Extremely pleasant. Accompained by daughter today. HEENT:  AT, moist mucus membranes.  Trachea midline, no masses. Cardiovascular: No clubbing, cyanosis, or edema. Respiratory: Normal respiratory effort, no increased work of breathing. GI: Abdomen is soft, nontender, nondistended, no  abdominal masses GU: No CVA tenderness. Skin: No rashes, bruises or suspicious lesions. Neurologic: Grossly intact, no focal deficits, moving all 4 extremities.  Ambulating deliberately, slow with cane. Psychiatric: Normal mood and affect.  Laboratory Data: Lab Results  Component Value Date   WBC 4.9 06/03/2011   HGB 11.8 (L) 06/03/2011   HCT 35.5 06/03/2011   MCV 85 06/03/2011   PLT 136 (L) 06/03/2011    Lab Results  Component Value Date   CREATININE 0.96 07/08/2012    Urinalysis Results for orders placed or performed during the hospital encounter of 07/19/16  Urinalysis, Complete w Microscopic  Result Value Ref Range   Color, Urine YELLOW YELLOW   APPearance CLEAR CLEAR   Specific Gravity, Urine 1.015 1.005 - 1.030   pH 5.5 5.0 - 8.0   Glucose, UA NEGATIVE NEGATIVE mg/dL   Hgb urine dipstick NEGATIVE NEGATIVE   Bilirubin Urine NEGATIVE  NEGATIVE   Ketones, ur NEGATIVE NEGATIVE mg/dL   Protein, ur NEGATIVE NEGATIVE mg/dL   Nitrite NEGATIVE NEGATIVE   Leukocytes, UA TRACE (A) NEGATIVE   Squamous Epithelial / LPF 0-5 (A) NONE SEEN   WBC, UA 0-5 0 - 5 WBC/hpf   RBC / HPF NONE SEEN 0 - 5 RBC/hpf   Bacteria, UA FEW (A) NONE SEEN   Hyaline Casts, UA PRESENT     Pertinent Imaging: n/a  Assessment & Plan:   Pleasant 83 year old female with worsening urinary urgency, frequency, incontinence over the past year.   We discussed possible various treatment options for her bladder overactivity including medication therapy. Given her age, I would like to avoid anticholinergics due to side effects and possible confusion. We discussed a trial of Mybetriq.  After lengthy discussion today, she ultimately decided for no intervention. She is not to take any more pills. She will live with her symptoms and is not particularly bothered by this. Both she and her daughter are in agreement. All questions were answered.  1. Urinary frequency - BLADDER SCAN AMB NON-IMAGING - Urine culture;  Future  2. Urge incontinence As above  3 Enuresis As above  Follow up as needed  Hollice Espy, MD  [facility] [address], [place], [place] [address] [phone]

## 2016-09-24 ENCOUNTER — Other Ambulatory Visit: Payer: Self-pay | Admitting: Internal Medicine

## 2016-09-24 DIAGNOSIS — Z1231 Encounter for screening mammogram for malignant neoplasm of breast: Secondary | ICD-10-CM

## 2016-10-02 ENCOUNTER — Ambulatory Visit
Admission: RE | Admit: 2016-10-02 | Discharge: 2016-10-02 | Disposition: A | Discharge status: Home / Self Care | Payer: Medicare HMO | Source: Ambulatory Visit | Attending: Internal Medicine | Admitting: Internal Medicine

## 2016-10-02 DIAGNOSIS — Z1231 Encounter for screening mammogram for malignant neoplasm of breast: Secondary | ICD-10-CM | POA: Insufficient documentation

## 2016-10-02 HISTORY — DX: Personal history of irradiation: Z92.3

## 2016-11-27 ENCOUNTER — Ambulatory Visit
Admission: EM | Admit: 2016-11-27 | Discharge: 2016-11-27 | Disposition: A | Discharge status: Home / Self Care | Payer: Medicare HMO | Attending: Family Medicine | Admitting: Family Medicine

## 2016-11-27 DIAGNOSIS — J069 Acute upper respiratory infection, unspecified: Secondary | ICD-10-CM

## 2016-11-27 DIAGNOSIS — B9789 Other viral agents as the cause of diseases classified elsewhere: Secondary | ICD-10-CM

## 2016-11-27 NOTE — ED Triage Notes (Signed)
Patient complains of cough and congestion, wheezing and shortness of breath that started yesterday.

## 2016-11-27 NOTE — ED Provider Notes (Signed)
[facility]    CSN: [id number] Arrival date & time: 11/27/16  1444     History   Chief Complaint Chief Complaint  Patient presents with  . Cough    HPI Shannon [name] is a 83 y.o. female.   The history is provided by the patient.  URI  Presenting symptoms: congestion, cough and rhinorrhea   Severity:  Moderate Onset quality:  Sudden Duration:  1 day Timing:  Constant Chronicity:  New Relieved by:  None tried Ineffective treatments:  None tried Associated symptoms: wheezing (slight, occasionally)   Associated symptoms: no headaches and no sinus pain   Risk factors: diabetes mellitus   Risk factors: not elderly, no chronic cardiac disease, no chronic kidney disease, no chronic respiratory disease, no immunosuppression, no recent illness, no recent travel and no sick contacts     Past Medical History:  Diagnosis Date  . Adenomatous polyps 11/28/2013   Overview:  Colonoscopy 03/04/2000  . Anemia 11/28/2013  . Benign essential hypertension 11/28/2013  . Breast cancer ([place])    right  . Cervical pain 05/16/2015  . Diabetic neuropathy ([place]) 11/28/2013  . GERD (gastroesophageal reflux disease)   . Glaucoma   . Hypertension   . Impingement syndrome of left shoulder 03/01/2014  . Lumbar spinal stenosis 09/23/2013  . Muscle cramps 11/29/2013  . Personal history of radiation therapy   . Primary osteoarthritis of left knee 04/06/2015  . Proteinuria 09/23/2013  . Status post total hip replacement, right 12/14/2013  . Thrombocytopenia ([place]) 06/05/2015  . Varicose veins of leg with swelling, bilateral 09/06/2014  . Venous insufficiency of both lower extremities 09/23/2013   Overview:  H/O ulceration    Patient Active Problem List   Diagnosis Date Noted  . Chronic low back pain 08/17/2015  . Thrombocytopenia ([place]) 06/05/2015  . Cervical pain 05/16/2015  . Primary osteoarthritis of left knee 04/06/2015  . Bilateral leg pain 01/18/2015  . Bilateral edema of lower  extremity 01/18/2015  . Varicose veins of leg with swelling, bilateral 09/06/2014  . Chronic pain of right knee 09/06/2014  . S/P hip replacement, right 06/15/2014  . Neck pain on left side 03/01/2014  . Impingement syndrome of left shoulder 03/01/2014  . Status post total hip replacement, right 12/14/2013  . Muscle cramps 11/29/2013  . Type 2 diabetes mellitus, controlled, with renal complications ([place]) 40/98/1191  . GERD (gastroesophageal reflux disease) 11/28/2013  . Benign essential hypertension 11/28/2013  . Diabetic neuropathy ([place]) 11/28/2013  . Anemia 11/28/2013  . Adenomatous polyps 11/28/2013  . Venous insufficiency of both lower extremities 09/23/2013  . Proteinuria 09/23/2013  . Lumbar spinal stenosis 09/23/2013  . Diabetes mellitus with neuropathy ([place]) 09/23/2013  . Status post right partial knee replacement 09/09/2013    Past Surgical History:  Procedure Laterality Date  . CHOLECYSTECTOMY    . JOINT REPLACEMENT    . REPLACEMENT TOTAL KNEE Right     OB History    No data available       Home Medications    Prior to Admission medications   Medication Sig Start Date End Date Taking? Authorizing Provider  acetaminophen (TYLENOL) 500 MG tablet Take 500 mg by mouth every 6 (six) hours as needed.   Yes [provider]  albuterol (PROVENTIL HFA;VENTOLIN HFA) 108 (90 BASE) MCG/ACT inhaler Inhale 2 puffs into the lungs every 4 (four) hours as needed for wheezing or shortness of breath. 03/16/15  Yes Frederich Cha, MD  amLODipine (NORVASC) 2.5 MG tablet Take by  mouth.   Yes [provider]  aspirin EC 81 MG tablet Take 81 mg by mouth daily.   Yes [provider]  CALCIUM ASCORBATE PO Take by mouth.   Yes [provider]  calcium carbonate (OS-CAL) 1250 (500 Ca) MG chewable tablet Chew by mouth.   Yes [provider]  Fish Oil-Cholecalciferol (FISH OIL + D3 PO) Take by mouth.   Yes [provider]  furosemide (LASIX)  20 MG tablet Take 20 mg by mouth.   Yes [provider]  latanoprost (XALATAN) 0.005 % ophthalmic solution 1 drop at bedtime.   Yes [provider]  losartan (COZAAR) 25 MG tablet Take 25 mg by mouth daily.   Yes [provider]  magnesium oxide (MAG-OX) 400 MG tablet Take 400 mg by mouth daily.   Yes [provider]  Multiple Vitamin (MULTI-VITAMINS) TABS Take by mouth.   Yes [provider]  Omega-3 Fatty Acids (FISH OIL PO) Take by mouth.   Yes [provider]  omeprazole (PRILOSEC) 10 MG capsule Take 10 mg by mouth daily.   Yes [provider]  vitamin B-12 (CYANOCOBALAMIN) 50 MCG tablet Take 50 mcg by mouth daily.   Yes [provider]  Vitamin D, Ergocalciferol, (DRISDOL) 50000 UNITS CAPS capsule Take 50,000 Units by mouth every 7 (seven) days.   Yes [provider]    Family History Family History  Problem Relation Age of Onset  . Diabetes Mother   . Heart failure Mother   . Heart failure Father   . Breast cancer Sister   . Prostate cancer Brother   . Kidney cancer Neg Hx   . Bladder Cancer Neg Hx     Social History Social History  Substance Use Topics  . Smoking status: Never Smoker  . Smokeless tobacco: Never Used  . Alcohol use No     Allergies   Tramadol and Trazodone and nefazodone   Review of Systems Review of Systems  HENT: Positive for congestion and rhinorrhea. Negative for sinus pain.   Respiratory: Positive for cough and wheezing (slight, occasionally).   Neurological: Negative for headaches.     Physical Exam Triage Vital Signs ED Triage Vitals  Enc Vitals Group     BP 11/27/16 1549 (!) 162/73     Pulse Rate 11/27/16 1549 67     Resp 11/27/16 1549 16     Temp 11/27/16 1549 98 F (36.7 C)     Temp Source 11/27/16 1549 Oral     SpO2 11/27/16 1549 98 %     Weight 11/27/16 1547 203 lb (92.1 kg)     Height 11/27/16 1547 5\' 4"  (1.626 m)     Head Circumference --       Peak Flow --      Pain Score 11/27/16 1547 1     Pain Loc --      Pain Edu? --      Excl. in [place]? --    No data found.   Updated Vital Signs BP (!) 162/73 (BP Location: Left Arm)   Pulse 67   Temp 98 F (36.7 C) (Oral)   Resp 16   Ht 5\' 4"  (1.626 m)   Wt 203 lb (92.1 kg)   SpO2 98%   BMI 34.84 kg/m   Visual Acuity Right Eye Distance:   Left Eye Distance:   Bilateral Distance:    Right Eye Near:   Left Eye Near:    Bilateral Near:  Physical Exam  Constitutional: She appears well-developed and well-nourished. No distress.  HENT:  Head: Normocephalic and atraumatic.  Right Ear: Tympanic membrane, external ear and ear canal normal.  Left Ear: Tympanic membrane, external ear and ear canal normal.  Nose: No mucosal edema, rhinorrhea, nose lacerations, sinus tenderness, nasal deformity, septal deviation or nasal septal hematoma. No epistaxis.  No foreign bodies. Right sinus exhibits no maxillary sinus tenderness and no frontal sinus tenderness. Left sinus exhibits no maxillary sinus tenderness and no frontal sinus tenderness.  Mouth/Throat: Uvula is midline, oropharynx is clear and moist and mucous membranes are normal. No oropharyngeal exudate.  Eyes: Pupils are equal, round, and reactive to light. Conjunctivae and EOM are normal. Right eye exhibits no discharge. Left eye exhibits no discharge. No scleral icterus.  Neck: Normal range of motion. Neck supple. No thyromegaly present.  Cardiovascular: Normal rate, regular rhythm and normal heart sounds.   Pulmonary/Chest: Effort normal and breath sounds normal. No respiratory distress. She has no wheezes. She has no rales.  Lymphadenopathy:    She has no cervical adenopathy.  Skin: She is not diaphoretic.  Nursing note and vitals reviewed.    UC Treatments / Results  Labs (all labs ordered are listed, but only abnormal results are displayed) Labs Reviewed - No data to display  EKG  EKG Interpretation None        Radiology No results found.  Procedures Procedures (including critical care time)  Medications Ordered in UC Medications - No data to display   Initial Impression / Assessment and Plan / UC Course  I have reviewed the triage vital signs and the nursing notes.  Pertinent labs & imaging results that were available during my care of the patient were reviewed by me and considered in my medical decision making (see chart for details).      Final Clinical Impressions(s) / UC Diagnoses   Final diagnoses:  Viral URI with cough    New Prescriptions Discharge Medication List as of 11/27/2016  4:08 PM     1. diagnosis reviewed with patient 2. Recommend supportive treatment with rest, fluids, otc meds 3. Follow-up prn if symptoms worsen or don't improve   Controlled Substance Prescriptions [place] Controlled Substance Registry consulted? Not Applicable   Norval Gable, MD 11/27/16 [phone]

## 2017-07-11 ENCOUNTER — Other Ambulatory Visit: Payer: Self-pay

## 2017-07-11 ENCOUNTER — Encounter: Payer: Self-pay | Admitting: Urology

## 2017-07-11 ENCOUNTER — Ambulatory Visit (INDEPENDENT_AMBULATORY_CARE_PROVIDER_SITE_OTHER): Payer: Medicare HMO | Admitting: Urology

## 2017-07-11 ENCOUNTER — Other Ambulatory Visit
Admission: RE | Admit: 2017-07-11 | Discharge: 2017-07-11 | Disposition: A | Discharge status: Home / Self Care | Payer: Medicare HMO | Source: Ambulatory Visit | Attending: Urology | Admitting: Urology

## 2017-07-11 VITALS — BP 139/70 | HR 70 | Ht 63.0 in | Wt 203.0 lb

## 2017-07-11 DIAGNOSIS — R350 Frequency of micturition: Secondary | ICD-10-CM

## 2017-07-11 DIAGNOSIS — R32 Unspecified urinary incontinence: Secondary | ICD-10-CM

## 2017-07-11 DIAGNOSIS — N3941 Urge incontinence: Secondary | ICD-10-CM | POA: Diagnosis not present

## 2017-07-11 DIAGNOSIS — R3129 Other microscopic hematuria: Secondary | ICD-10-CM | POA: Diagnosis present

## 2017-07-11 LAB — URINALYSIS, COMPLETE (UACMP) WITH MICROSCOPIC
BILIRUBIN URINE: NEGATIVE
Glucose, UA: NEGATIVE mg/dL
Hgb urine dipstick: NEGATIVE
KETONES UR: NEGATIVE mg/dL
Nitrite: NEGATIVE
PH: 5.5 (ref 5.0–8.0)
Protein, ur: NEGATIVE mg/dL
Specific Gravity, Urine: 1.01 (ref 1.005–1.030)

## 2017-07-11 NOTE — Progress Notes (Signed)
07/11/2017 9:41 AM   Shannon [name] 1924-08-10 [id number]  Referring provider: Ezequiel Kayser, MD [place] [facility] [place], [place] [address]  Chief Complaint  Patient presents with  . Urinary Frequency    HPI: 84 year old female who returns today approximately 1 year from her previous evaluation for ongoing symptoms of urinary frequency, urgency, nocturia, and enuresis.  On her initial visit, she was treated for an occult UTI without significant improvement of her symptoms.  She was subsequently offered a trial of Myrbetriq but ultimately declined.  At the time, she states she was not interested in taking another pill.  She continues to have persistent and aggravating symptoms.  Both she and her daughter are interested to learn about other options.  No recent UTIs.  No dysuria or gross hematuria.    She does have multiple medical comorbidities including lower extremity edema on chronic Lasix, diabetes, and occasional confusion.  Urinalysis today shows 21-50 white blood cells per high-power field, few bacteria, otherwise unremarkable.  Post void residuals previously minimal.   PMH: Past Medical History:  Diagnosis Date  . Adenomatous polyps 11/28/2013   Overview:  Colonoscopy 03/04/2000  . Anemia 11/28/2013  . Benign essential hypertension 11/28/2013  . Breast cancer ([place])    right  . Cervical pain 05/16/2015  . Diabetic neuropathy ([place]) 11/28/2013  . GERD (gastroesophageal reflux disease)   . Glaucoma   . Hypertension   . Impingement syndrome of left shoulder 03/01/2014  . Lumbar spinal stenosis 09/23/2013  . Muscle cramps 11/29/2013  . Personal history of radiation therapy   . Primary osteoarthritis of left knee 04/06/2015  . Proteinuria 09/23/2013  . Status post total hip replacement, right 12/14/2013  . Thrombocytopenia ([place]) 06/05/2015  . Varicose veins of leg with swelling, bilateral 09/06/2014  . Venous insufficiency of both lower extremities  09/23/2013   Overview:  H/O ulceration    Surgical History: Past Surgical History:  Procedure Laterality Date  . CHOLECYSTECTOMY    . JOINT REPLACEMENT    . REPLACEMENT TOTAL KNEE Right     Home Medications:  Allergies as of 07/11/2017      Reactions   Tramadol    MIND ALTERING   Trazodone And Nefazodone Other (See Comments)   Hallucinate      Medication List        Accurate as of 07/11/17 11:59 PM. Always use your most recent med list.          acetaminophen 500 MG tablet Commonly known as:  TYLENOL Take 500 mg by mouth every 6 (six) hours as needed.   albuterol 108 (90 Base) MCG/ACT inhaler Commonly known as:  PROVENTIL HFA;VENTOLIN HFA Inhale 2 puffs into the lungs every 4 (four) hours as needed for wheezing or shortness of breath.   amLODipine 2.5 MG tablet Commonly known as:  NORVASC Take by mouth.   aspirin EC 81 MG tablet Take 81 mg by mouth daily.   CALCIUM ASCORBATE PO Take by mouth.   calcium carbonate 1250 (500 Ca) MG chewable tablet Commonly known as:  OS-CAL Chew by mouth.   FISH OIL + D3 PO Take by mouth.   FISH OIL PO Take by mouth.   furosemide 20 MG tablet Commonly known as:  LASIX Take 20 mg by mouth.   latanoprost 0.005 % ophthalmic solution Commonly known as:  XALATAN 1 drop at bedtime.   losartan 25 MG tablet Commonly known as:  COZAAR Take 25 mg by mouth daily.  magnesium oxide 400 MG tablet Commonly known as:  MAG-OX Take 400 mg by mouth daily.   MULTI-VITAMINS Tabs Take by mouth.   omeprazole 10 MG capsule Commonly known as:  PRILOSEC Take 10 mg by mouth daily.   vitamin B-12 50 MCG tablet Commonly known as:  CYANOCOBALAMIN Take 50 mcg by mouth daily.   Vitamin D (Ergocalciferol) 50000 units Caps capsule Commonly known as:  DRISDOL Take 50,000 Units by mouth every 7 (seven) days.       Allergies:  Allergies  Allergen Reactions  . Tramadol     MIND ALTERING  . Trazodone And Nefazodone Other (See  Comments)    Hallucinate    Family History: Family History  Problem Relation Age of Onset  . Diabetes Mother   . Heart failure Mother   . Heart failure Father   . Breast cancer Sister   . Prostate cancer Brother   . Kidney cancer Neg Hx   . Bladder Cancer Neg Hx     Social History:  reports that she has never smoked. She has never used smokeless tobacco. She reports that she does not drink alcohol or use drugs.  ROS: UROLOGY Frequent Urination?: Yes Hard to postpone urination?: No Burning/pain with urination?: No Get up at night to urinate?: No Leakage of urine?: Yes Urine stream starts and stops?: No Trouble starting stream?: No Do you have to strain to urinate?: No Blood in urine?: No Urinary tract infection?: No Sexually transmitted disease?: No Injury to kidneys or bladder?: No Painful intercourse?: No Weak stream?: No Currently pregnant?: No Vaginal bleeding?: No Last menstrual period?: n  Gastrointestinal Nausea?: No Vomiting?: No Indigestion/heartburn?: No Diarrhea?: No Constipation?: No  Constitutional Fever: No Night sweats?: No Weight loss?: No Fatigue?: No  Skin Skin rash/lesions?: No Itching?: No  Eyes Blurred vision?: No Double vision?: No  Ears/Nose/Throat Sore throat?: No Sinus problems?: No  Hematologic/Lymphatic Swollen glands?: No Easy bruising?: No  Cardiovascular Leg swelling?: No Chest pain?: No  Respiratory Cough?: No Shortness of breath?: No  Endocrine Excessive thirst?: No  Musculoskeletal Back pain?: No Joint pain?: No  Neurological Headaches?: No Dizziness?: No  Psychologic Depression?: No Anxiety?: No  Physical Exam: BP 139/70   Pulse 70   Ht 5\' 3"  (1.6 m)   Wt 203 lb (92.1 kg)   BMI 35.96 kg/m   Constitutional:  Alert and oriented, No acute distress.  Elderly, occasionally becomes confused and cannot quite follow the conversation or answer all questions appropriately, easily reoriented.   Accompanied by daughter today. HEENT: [place] AT, moist mucus membranes.  Trachea midline, no masses. Cardiovascular: 2+ bilateral lower extremity edema. Respiratory: Normal respiratory effort, no increased work of breathing. GI: Abdomen is soft, nontender, nondistended, no abdominal masses Skin: No rashes, bruises or suspicious lesions. Neurologic: Grossly intact, no focal deficits, moving all 4 extremities.  Unsteady gait. Psychiatric: Normal mood and affect.  Laboratory Data: Cr 0.8 on 06/05/2017 A1c 5.5  Urinalysis Results for orders placed or performed during the hospital encounter of 07/11/17  Urine Culture  Result Value Ref Range   Specimen Description      URINE, CLEAN CATCH Performed at [facility], [address] [place], [place] [address]    Special Requests      NONE Performed at [facility], [address], [place], [place] [address]    Culture      CORRECTED RESULTS MULTIPLE SPECIES PRESENT, SUGGEST RECOLLECTION PREVIOUSLY REPORTED AS: STAPHYLOCOCCUS AUREUS CULTURE REINCUBATED FOR  BETTER GROWTH CORRECTED RESULTS CALLED TO: RN Luz Lex [id number] MLM Performed at [facility], [place] [address], [place], [place] [address]    Report Status 07/14/2017 FINAL   Urinalysis, Complete w Microscopic  Result Value Ref Range   Color, Urine YELLOW YELLOW   APPearance HAZY (A) CLEAR   Specific Gravity, Urine 1.010 1.005 - 1.030   pH 5.5 5.0 - 8.0   Glucose, UA NEGATIVE NEGATIVE mg/dL   Hgb urine dipstick NEGATIVE NEGATIVE   Bilirubin Urine NEGATIVE NEGATIVE   Ketones, ur NEGATIVE NEGATIVE mg/dL   Protein, ur NEGATIVE NEGATIVE mg/dL   Nitrite NEGATIVE NEGATIVE   Leukocytes, UA MODERATE (A) NEGATIVE   Squamous Epithelial / LPF 0-5 (A) NONE SEEN   WBC, UA 21-50 0 - 5 WBC/hpf   RBC / HPF 0-5 0 - 5 RBC/hpf   Bacteria, UA FEW (A) NONE SEEN   WBC Clumps PRESENT     Pertinent Imaging: N/a  Assessment & Plan:    1. Urinary frequency Urine  culture to rule out occult infection again today Urinary symptoms likely multifactorial, discussed behavioral modification today at length.  Unfortunately, given her lower extremity edema, Lasix is appropriate and necessary medication but is likely exacerbating her symptoms.  We discussed that given her age and concern for cognitive changes, I would like to avoid anticholinergics.  We discussed Myrbetriq again today 25 mg and she was given 1 month samples of this dose as well as an additional month of 50 mg samples to titrate up dose if the 25 mg dose is ineffective.  We also discussed briefly options for refractory symptoms if this is ineffective.  Her daughter will call us and let us know how the Myrbetriq is working and we will devise a plan based on the above.  - Urine Culture; Future  2. Urge incontinence As above  3. Enuresis As above  Patient's daughter to call after trial of meds  Hollice Espy, MD  [facility] [address], [place],  [address] [phone]

## 2017-07-14 LAB — URINE CULTURE

## 2017-07-24 ENCOUNTER — Ambulatory Visit: Payer: Medicare HMO | Admitting: Urology

## 2017-08-25 ENCOUNTER — Other Ambulatory Visit: Payer: Self-pay | Admitting: Internal Medicine

## 2017-08-25 DIAGNOSIS — Z1231 Encounter for screening mammogram for malignant neoplasm of breast: Secondary | ICD-10-CM

## 2017-10-06 ENCOUNTER — Ambulatory Visit
Admission: RE | Admit: 2017-10-06 | Discharge: 2017-10-06 | Disposition: A | Discharge status: Home / Self Care | Payer: Medicare HMO | Source: Ambulatory Visit | Attending: Internal Medicine | Admitting: Internal Medicine

## 2017-10-06 DIAGNOSIS — Z1231 Encounter for screening mammogram for malignant neoplasm of breast: Secondary | ICD-10-CM | POA: Diagnosis present

## 2018-10-06 ENCOUNTER — Other Ambulatory Visit: Payer: Self-pay | Admitting: Internal Medicine

## 2018-10-06 DIAGNOSIS — Z1231 Encounter for screening mammogram for malignant neoplasm of breast: Secondary | ICD-10-CM

## 2018-10-13 ENCOUNTER — Other Ambulatory Visit: Payer: Self-pay

## 2018-10-13 DIAGNOSIS — N3941 Urge incontinence: Secondary | ICD-10-CM

## 2018-10-16 ENCOUNTER — Encounter: Payer: Self-pay | Admitting: Urology

## 2018-10-16 ENCOUNTER — Other Ambulatory Visit
Admission: RE | Admit: 2018-10-16 | Discharge: 2018-10-16 | Disposition: A | Discharge status: Home / Self Care | Payer: Medicare HMO | Attending: Urology | Admitting: Urology

## 2018-10-16 ENCOUNTER — Other Ambulatory Visit: Payer: Self-pay

## 2018-10-16 ENCOUNTER — Ambulatory Visit (INDEPENDENT_AMBULATORY_CARE_PROVIDER_SITE_OTHER): Payer: Medicare HMO | Admitting: Urology

## 2018-10-16 VITALS — BP 132/70 | HR 75 | Ht 66.0 in | Wt 185.0 lb

## 2018-10-16 DIAGNOSIS — N3941 Urge incontinence: Secondary | ICD-10-CM | POA: Diagnosis present

## 2018-10-16 LAB — URINALYSIS, COMPLETE (UACMP) WITH MICROSCOPIC
Bilirubin Urine: NEGATIVE
Glucose, UA: NEGATIVE mg/dL
Hgb urine dipstick: NEGATIVE
Ketones, ur: NEGATIVE mg/dL
Nitrite: NEGATIVE
Protein, ur: NEGATIVE mg/dL
Specific Gravity, Urine: 1.015 (ref 1.005–1.030)
pH: 5.5 (ref 5.0–8.0)

## 2018-10-16 LAB — BLADDER SCAN AMB NON-IMAGING

## 2018-10-16 MED ORDER — MIRABEGRON ER 50 MG PO TB24: 50.0000 mg | ORAL_TABLET | Freq: Every day | ORAL | 0 refills | Status: DC

## 2018-10-16 NOTE — Progress Notes (Signed)
10/16/2018 2:29 PM   Shannon [name] 1925-02-05 [id number]  Referring provider: Ezequiel Kayser, MD [place] [facility] [place],  [place] [address]  Chief Complaint  Patient presents with   Urinary Frequency    follow up   Urinary Incontinence    HPI: 85 year old female who returns today with ongoing issues with urinary incontinence at nighttime and frequency.     She was seen a year ago with the exact same issue.  Please see previous note for details.  Patient is unaccompanied today and somewhat of a limited historian, hard of hearing.  We asked that her daughter join [place] in the room, however the patient was adamant that she not participate in that she does not know anything about her medications or her symptoms.  She denies dysuria or gross hematuria.  She believes she still takes a "fluid pill".  She continues to complain of urinary frequency, urgency, daytime and nighttime incontinence.  This is unchanged.  She does not recall being prescribed or given samples of Myrbetriq last year.  She is not sure if it helped or not.  UA today is fairly unremarkable most consistent with contamination.  PVR is minimal.   PMH: Past Medical History:  Diagnosis Date   Adenomatous polyps 11/28/2013   Overview:  Colonoscopy 03/04/2000   Anemia 11/28/2013   Benign essential hypertension 11/28/2013   Breast cancer ([place])    right   Cervical pain 05/16/2015   Diabetic neuropathy ([place]) 11/28/2013   GERD (gastroesophageal reflux disease)    Glaucoma    Hypertension    Impingement syndrome of left shoulder 03/01/2014   Lumbar spinal stenosis 09/23/2013   Muscle cramps 11/29/2013   Personal history of radiation therapy    Primary osteoarthritis of left knee 04/06/2015   Proteinuria 09/23/2013   Status post total hip replacement, right 12/14/2013   Thrombocytopenia ([place]) 06/05/2015   Varicose veins of leg with swelling, bilateral 09/06/2014   Venous insufficiency of  both lower extremities 09/23/2013   Overview:  H/O ulceration    Surgical History: Past Surgical History:  Procedure Laterality Date   CHOLECYSTECTOMY     JOINT REPLACEMENT     REPLACEMENT TOTAL KNEE Right     Home Medications:  Allergies as of 10/16/2018      Reactions   Tramadol    MIND ALTERING   Trazodone And Nefazodone Other (See Comments)   Hallucinate      Medication List       Accurate as of October 16, 2018  2:29 PM. If you have any questions, ask your nurse or doctor.        acetaminophen 500 MG tablet Commonly known as: TYLENOL Take 500 mg by mouth every 6 (six) hours as needed.   albuterol 108 (90 Base) MCG/ACT inhaler Commonly known as: VENTOLIN HFA Inhale 2 puffs into the lungs every 4 (four) hours as needed for wheezing or shortness of breath.   amLODipine 2.5 MG tablet Commonly known as: NORVASC Take by mouth.   aspirin EC 81 MG tablet Take 81 mg by mouth daily.   CALCIUM ASCORBATE PO Take by mouth.   calcium carbonate 1250 (500 Ca) MG chewable tablet Commonly known as: OS-CAL Chew by mouth.   FISH OIL + D3 PO Take by mouth.   FISH OIL PO Take by mouth.   furosemide 20 MG tablet Commonly known as: LASIX Take 20 mg by mouth.   latanoprost 0.005 % ophthalmic solution Commonly known as: Ivin Poot  1 drop at bedtime.   losartan 25 MG tablet Commonly known as: COZAAR Take 25 mg by mouth daily.   magnesium oxide 400 MG tablet Commonly known as: MAG-OX Take 400 mg by mouth daily.   Multi-Vitamins Tabs Take by mouth.   omeprazole 10 MG capsule Commonly known as: PRILOSEC Take 10 mg by mouth daily.   vitamin B-12 50 MCG tablet Commonly known as: CYANOCOBALAMIN Take 50 mcg by mouth daily.   Vitamin D (Ergocalciferol) 1.25 MG (50000 UT) Caps capsule Commonly known as: DRISDOL Take 50,000 Units by mouth every 7 (seven) days.       Allergies:  Allergies  Allergen Reactions   Tramadol     MIND ALTERING   Trazodone And  Nefazodone Other (See Comments)    Hallucinate    Family History: Family History  Problem Relation Age of Onset   Diabetes Mother    Heart failure Mother    Heart failure Father    Breast cancer Sister    Prostate cancer Brother    Kidney cancer Neg Hx    Bladder Cancer Neg Hx     Social History:  reports that she has never smoked. She has never used smokeless tobacco. She reports that she does not drink alcohol or use drugs.  ROS: UROLOGY Frequent Urination?: Yes Hard to postpone urination?: No Burning/pain with urination?: No Get up at night to urinate?: No Leakage of urine?: Yes Urine stream starts and stops?: No Trouble starting stream?: No Do you have to strain to urinate?: No Blood in urine?: No Urinary tract infection?: No Sexually transmitted disease?: No Injury to kidneys or bladder?: No Painful intercourse?: No Weak stream?: No Currently pregnant?: No Vaginal bleeding?: No Last menstrual period?: n  Gastrointestinal Nausea?: No Vomiting?: No Indigestion/heartburn?: No Diarrhea?: No Constipation?: No  Constitutional Fever: No Night sweats?: No Weight loss?: No Fatigue?: No  Skin Skin rash/lesions?: No Itching?: No  Eyes Blurred vision?: No Double vision?: No  Ears/Nose/Throat Sore throat?: No Sinus problems?: No  Hematologic/Lymphatic Swollen glands?: No Easy bruising?: No  Cardiovascular Leg swelling?: No Chest pain?: No  Respiratory Cough?: No Shortness of breath?: No  Endocrine Excessive thirst?: No  Musculoskeletal Back pain?: No Joint pain?: No  Neurological Headaches?: No Dizziness?: No  Psychologic Depression?: No Anxiety?: No  Physical Exam: BP 132/70    Pulse 75    Ht 5\' 6"  (1.676 m)    Wt 185 lb (83.9 kg)    BMI 29.86 kg/m   Constitutional:  Alert and oriented, No acute distress.  Elderly, frail, hard of hearing.  In wheelchair. HEENT: [place] AT, moist mucus membranes.  Trachea midline, no  masses. Cardiovascular: No clubbing, cyanosis, or edema. Respiratory: Normal respiratory effort, no increased work of breathing. Skin: No rashes, bruises or suspicious lesions. Neurologic: Grossly intact, no focal deficits, moving all 4 extremities. Psychiatric: Normal mood and affect.  Laboratory Data: Lab Results  Component Value Date   WBC 4.9 06/03/2011   HGB 11.8 (L) 06/03/2011   HCT 35.5 06/03/2011   MCV 85 06/03/2011   PLT 136 (L) 06/03/2011    Urinalysis Results for orders placed or performed during the hospital encounter of 10/16/18  Urinalysis, Complete w Microscopic (For [facility] ONLY)  Result Value Ref Range   Color, Urine YELLOW YELLOW   APPearance CLEAR CLEAR   Specific Gravity, Urine 1.015 1.005 - 1.030   pH 5.5 5.0 - 8.0   Glucose, UA NEGATIVE NEGATIVE mg/dL   Hgb urine dipstick NEGATIVE NEGATIVE  Bilirubin Urine NEGATIVE NEGATIVE   Ketones, ur NEGATIVE NEGATIVE mg/dL   Protein, ur NEGATIVE NEGATIVE mg/dL   Nitrite NEGATIVE NEGATIVE   Leukocytes,Ua SMALL (A) NEGATIVE   Squamous Epithelial / LPF 11-20 0 - 5   WBC, UA 11-20 0 - 5 WBC/hpf   RBC / HPF 0-5 0 - 5 RBC/hpf   Bacteria, UA RARE (A) NONE SEEN    Pertinent Imaging: PVR 0   Assessment & Plan:    1. Urge incontinence Persistent severe urinary incontinence-multifactorial No evidence of incomplete bladder emptying No evidence of UTI today, urine appears to be contaminated-we will send for culture as a precaution although not suspected Given samples of Myrbetriq 50 mg to to try again, advised to call us if this is effective or helpful Options are somewhat limited otherwise, discussed refractory treatment including PTNS/Botox but not interested in the use Given her age and comorbidities, not a candidate for anticholinergics - BLADDER SCAN AMB NON-IMAGING   Hollice Espy, MD  [facility] [address], [place], [place] [address] [phone]

## 2018-10-18 LAB — URINE CULTURE: Culture: >=100000 — AB

## 2018-10-20 ENCOUNTER — Inpatient Hospital Stay: Admission: RE | Admit: 2018-10-20 | Payer: Medicare HMO | Source: Ambulatory Visit

## 2018-11-04 ENCOUNTER — Ambulatory Visit
Admission: RE | Admit: 2018-11-04 | Discharge: 2018-11-04 | Disposition: A | Discharge status: Home / Self Care | Payer: Medicare HMO | Source: Ambulatory Visit | Attending: Internal Medicine | Admitting: Internal Medicine

## 2018-11-04 ENCOUNTER — Other Ambulatory Visit: Payer: Self-pay

## 2018-11-04 DIAGNOSIS — Z1231 Encounter for screening mammogram for malignant neoplasm of breast: Secondary | ICD-10-CM | POA: Insufficient documentation

## 2018-11-09 ENCOUNTER — Other Ambulatory Visit: Payer: Self-pay | Admitting: Internal Medicine

## 2018-11-09 DIAGNOSIS — N6489 Other specified disorders of breast: Secondary | ICD-10-CM

## 2018-11-09 DIAGNOSIS — R928 Other abnormal and inconclusive findings on diagnostic imaging of breast: Secondary | ICD-10-CM

## 2018-11-19 ENCOUNTER — Ambulatory Visit
Admission: RE | Admit: 2018-11-19 | Discharge: 2018-11-19 | Disposition: A | Discharge status: Home / Self Care | Payer: Medicare HMO | Source: Ambulatory Visit | Attending: Internal Medicine | Admitting: Internal Medicine

## 2018-11-19 ENCOUNTER — Other Ambulatory Visit: Payer: Self-pay

## 2018-11-19 DIAGNOSIS — R928 Other abnormal and inconclusive findings on diagnostic imaging of breast: Secondary | ICD-10-CM | POA: Diagnosis present

## 2018-11-19 DIAGNOSIS — N6489 Other specified disorders of breast: Secondary | ICD-10-CM | POA: Insufficient documentation

## 2019-01-13 ENCOUNTER — Telehealth: Payer: Self-pay | Admitting: Urology

## 2019-01-13 MED ORDER — MIRABEGRON ER 50 MG PO TB24: 50.0000 mg | ORAL_TABLET | Freq: Every day | ORAL | 2 refills | Status: DC

## 2019-01-13 NOTE — Telephone Encounter (Signed)
It looks like this patient was just added to my clinic schedule on Friday.  When I saw her last, I given her samples of Myrbetriq to try and she was to call us and let us know how things went.  She did not do this.  Looks like she is coming back with the same problem.  Can you reach out to her and find out what is going on with her?  Was the Myrbetriq helpful?  Hollice Espy, MD

## 2019-01-13 NOTE — Telephone Encounter (Signed)
Patient aware appointment cancelled for Friday-Sent in prescription to Shannon [name] [name] as requested-offered to make 6 month follow up with Shannon [name]-she declined stated she would call back to reschedule. Verbalized understanding.

## 2019-01-13 NOTE — Telephone Encounter (Signed)
No need for an appointment.  We can just fill this prescription, Myrbetriq 50 mg.  She can follow-up with [facility] in 6 months for symptom recheck and bladder scan.  Hollice Espy, MD

## 2019-01-13 NOTE — Telephone Encounter (Signed)
She has been having ongoing nocturia-she was taking Myrbetriq which improved her symptoms, she ran out of myrbertriq two months ago.

## 2019-01-14 ENCOUNTER — Telehealth: Payer: Self-pay | Admitting: Urology

## 2019-01-14 MED ORDER — MIRABEGRON ER 50 MG PO TB24: 50.0000 mg | ORAL_TABLET | Freq: Every day | ORAL | 2 refills | Status: AC

## 2019-01-14 NOTE — Telephone Encounter (Signed)
Verified with patient and pharmacy-resent in RX

## 2019-01-14 NOTE — Telephone Encounter (Signed)
Pt called and said RX for Myrbetriq wasn't sent to [facility] in [place].

## 2019-01-15 ENCOUNTER — Ambulatory Visit: Payer: Medicare HMO | Admitting: Urology

## 2019-01-18 ENCOUNTER — Telehealth: Payer: Self-pay | Admitting: Urology

## 2019-01-18 NOTE — Telephone Encounter (Signed)
Patient called and said she can not afford this medication and wants to know if there is anything else that she can call in that would be cheaper for her?    Shannon [name]

## 2019-01-18 NOTE — Telephone Encounter (Signed)
There are any good alternatives.  At age 85, I am concerned about using a class a medication called anticholinergics as it can cause confusion and other significant side effects.  And will strongly recommend Myrbetriq.  If she like to combine pick up samples, we can arrange to do that for at least a little while.  Hollice Espy, MD

## 2019-01-19 NOTE — Telephone Encounter (Signed)
Informed patient-she will pick up Myrbetriq 50mg  samples on 01/22/19 at the [facility] office. Verbalized understanding.

## 2019-07-15 ENCOUNTER — Encounter: Payer: Self-pay | Admitting: Urology

## 2019-07-15 ENCOUNTER — Ambulatory Visit: Payer: Self-pay | Admitting: Urology

## 2019-12-01 IMAGING — MG DIGITAL DIAGNOSTIC UNILATERAL RIGHT MAMMOGRAM WITH TOMO AND CAD
6 of 10 series · 6 of 30 positions shown · non-contrast
Comparison: Previous exam(s).

CLINICAL DATA: Screening recall for a possible right breast mass.
The patient has history of a remote right breast lumpectomy with
radiation.

EXAM:
DIGITAL DIAGNOSTIC RIGHT MAMMOGRAM WITH CAD AND TOMO
ULTRASOUND RIGHT BREAST

[L CC synth-2D]
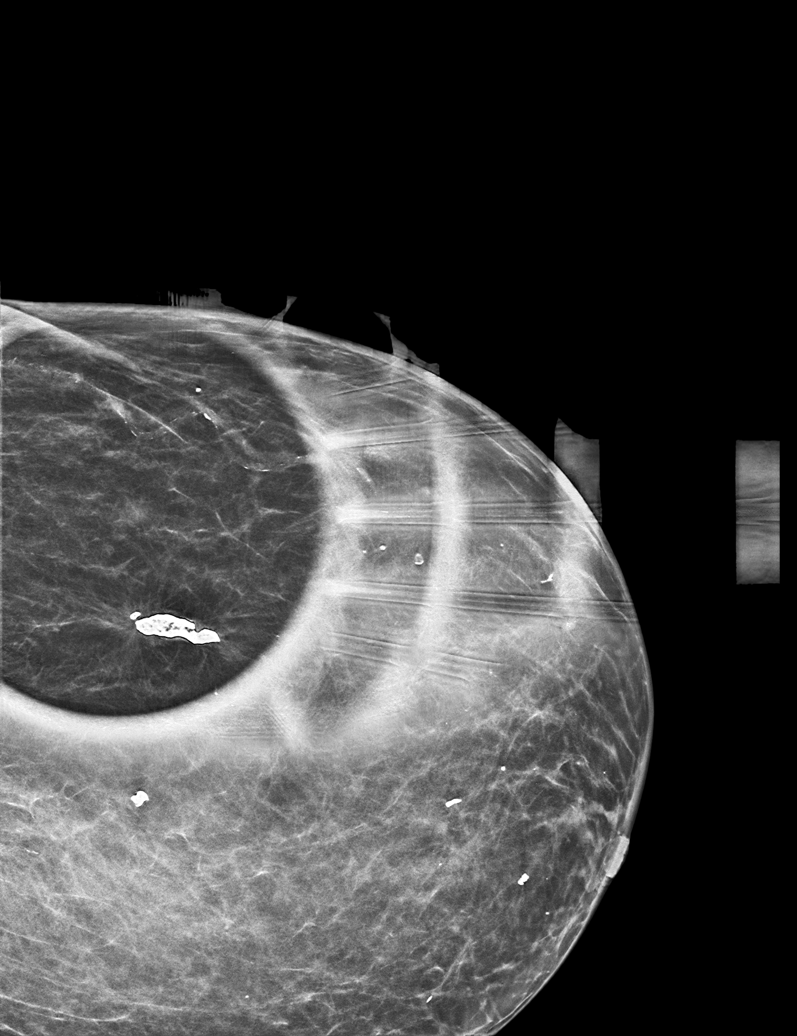

[R MLO synth-2D (1 of 2)]
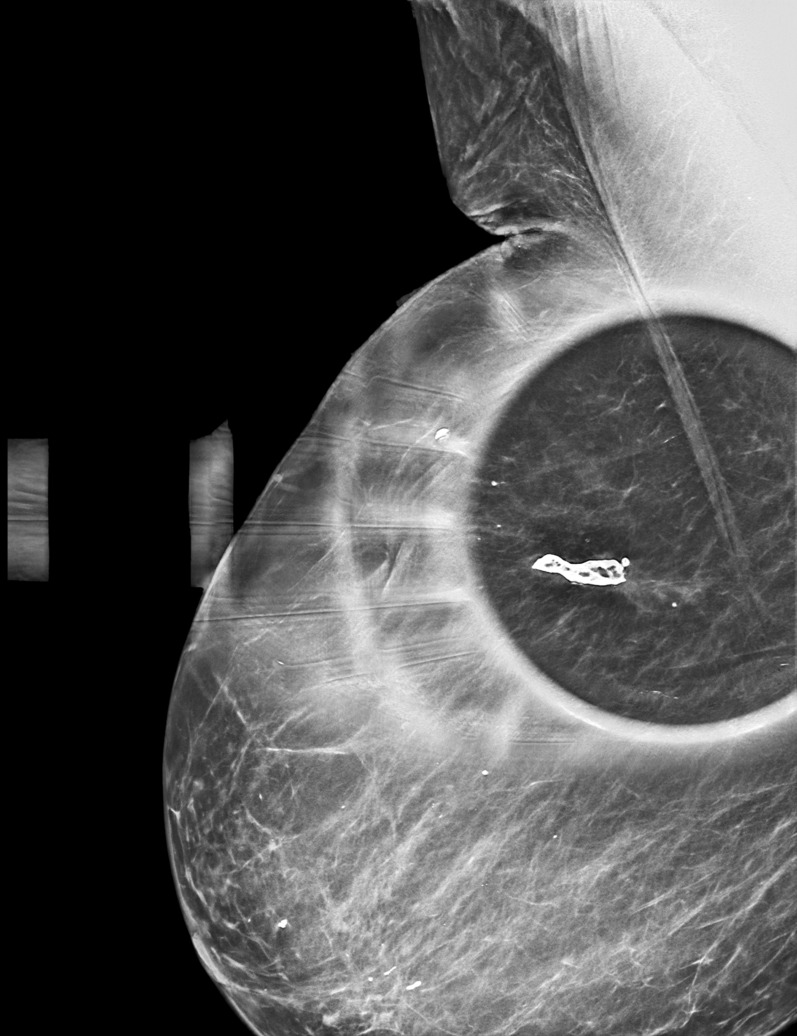

[R ML synth-2D]
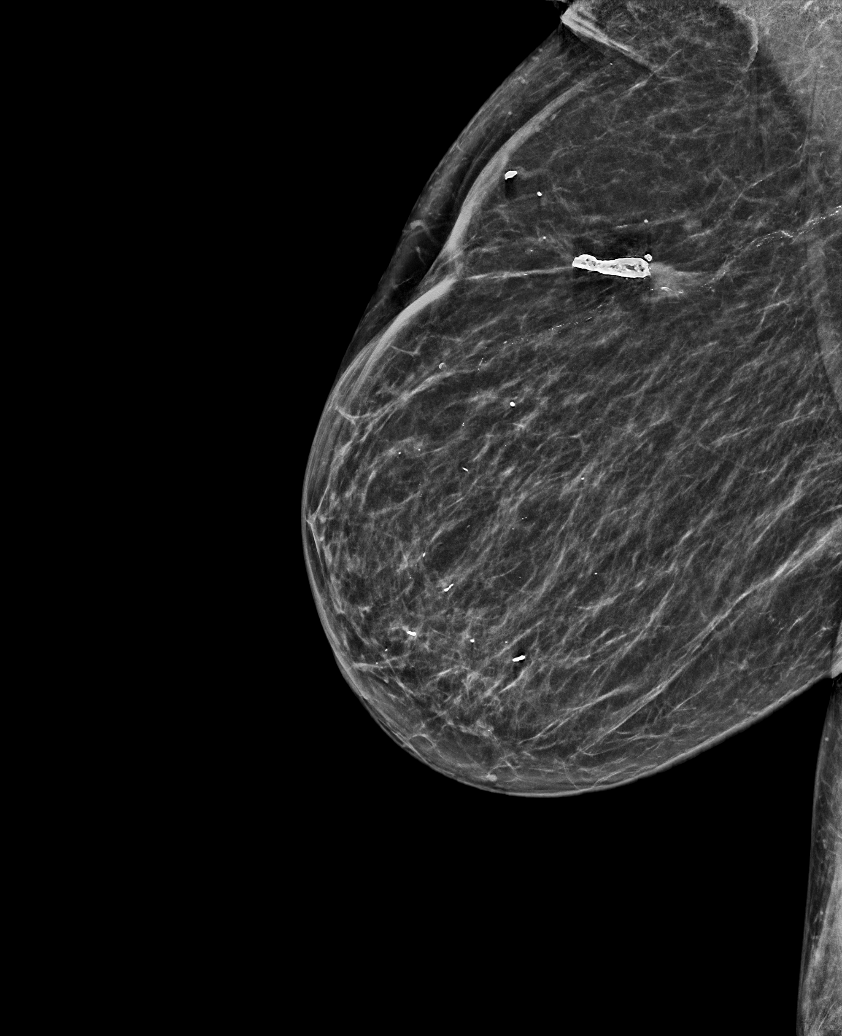

[R MLO synth-2D (2 of 2)]
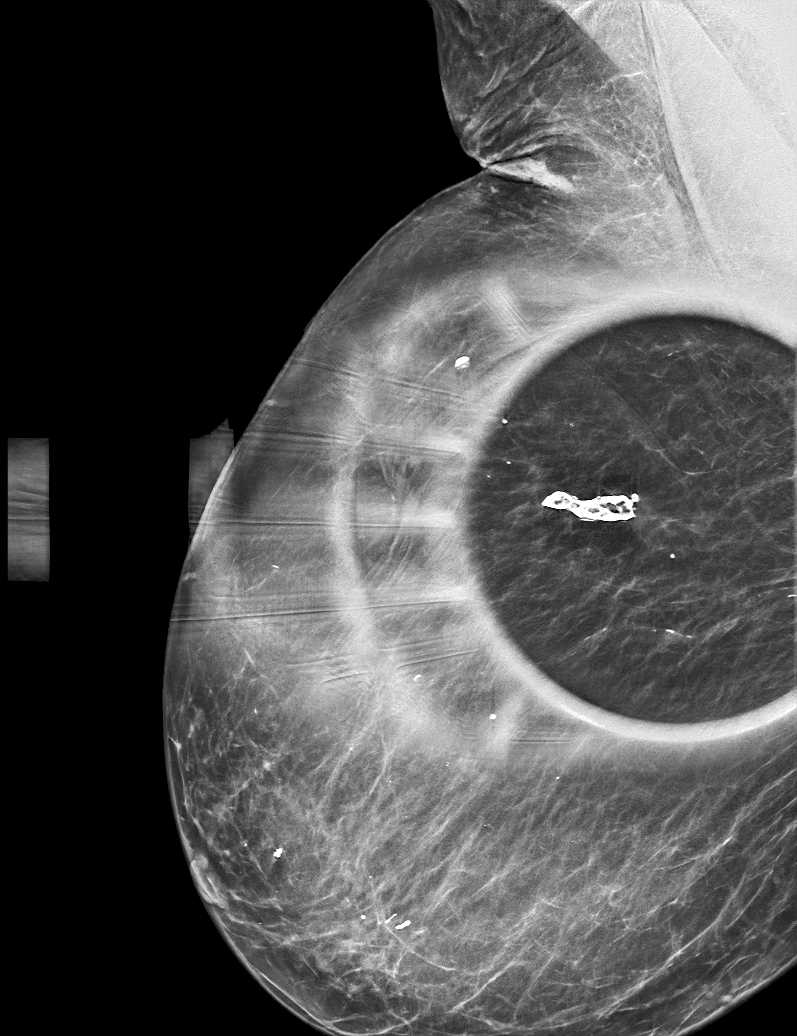

[R CC synth-2D]
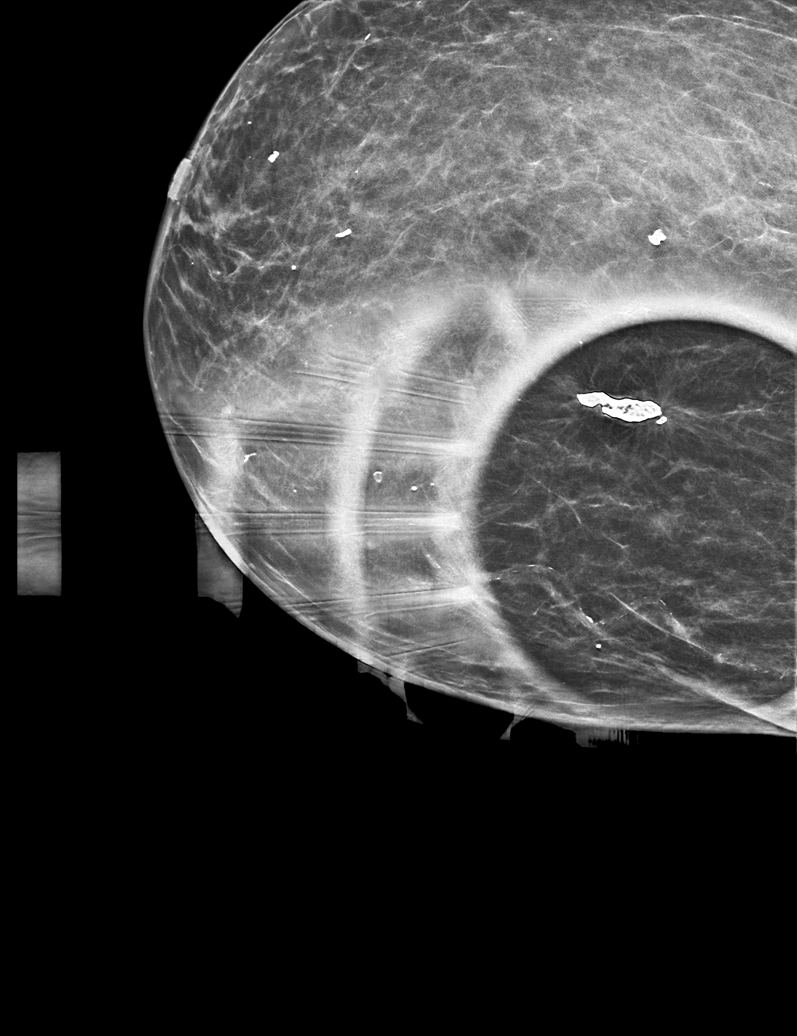

[L CC tomo · tomo slice 27/53.0]
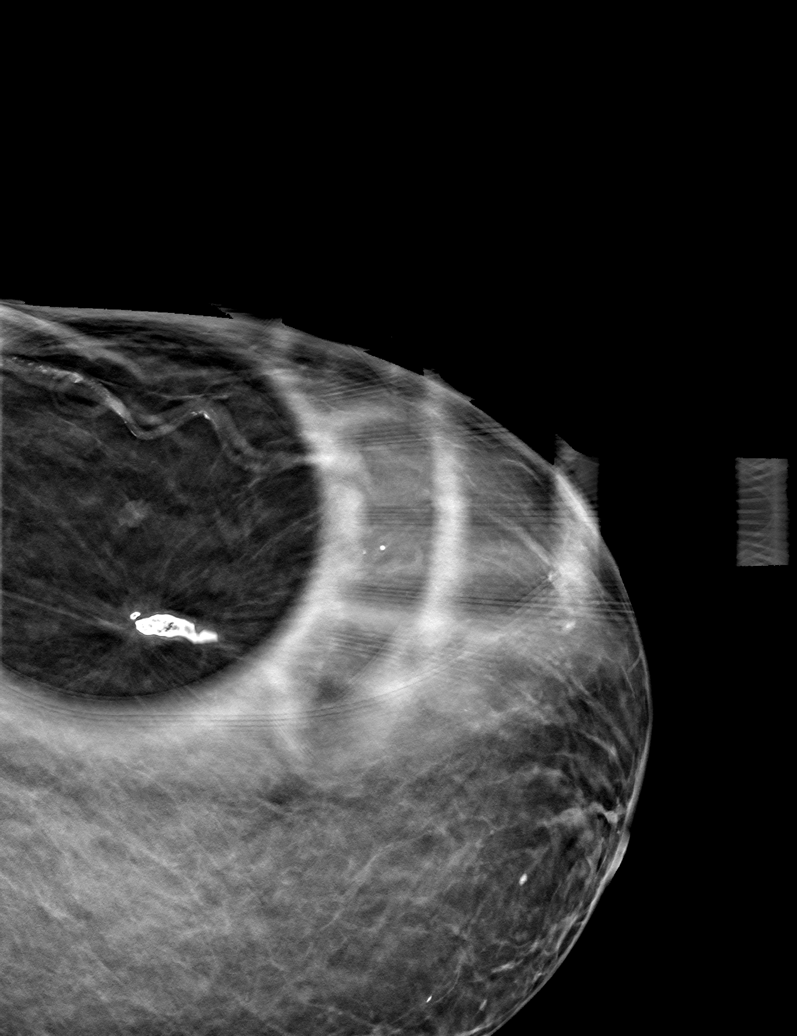

[6 of 30 positions shown; findings below may reference images not displayed]

ACR Breast Density Category b: There are scattered areas of
fibroglandular density.
FINDINGS: Spot compression tomosynthesis imaging through the upper inner
quadrant of the right breast, slightly posterior and medial to the
lumpectomy site, there is an oval mass measuring approximately 5 mm.
On the CC and MLO view, the mass has indistinct margins, however on
the true lateral view the margins appear circumscribed.

Mammographic images were processed with CAD.

Ultrasound of the right breast at 1 o'clock, 12 cm from the nipple
demonstrates a septated anechoic mass measuring 4 x 3 x 3 mm.
IMPRESSION: 1. The right breast mass at 1 o'clock corresponds with a benign
septated cyst.

2. Stable right breast lumpectomy site. No mammographic evidence of
malignancy in the bilateral breasts.

RECOMMENDATION:
Screening mammogram in one year.(Code:[id number])

I have discussed the findings and recommendations with the patient.
Results were also provided in writing at the conclusion of the
visit. If applicable, a reminder letter will be sent to the patient
regarding the next appointment.

BI-RADS CATEGORY  2: Benign.

## 2020-04-08 DEATH — deceased
